# Patient Record
Sex: Female | Born: 1975 | Race: White | Hispanic: No | Marital: Married | Smoking: Never smoker
Health system: Southern US, Community
[De-identification: ages and names within clinical notes are randomized; demographics above are authoritative.]

## PROBLEM LIST (undated history)

## (undated) DIAGNOSIS — F419 Anxiety disorder, unspecified: Secondary | ICD-10-CM

## (undated) DIAGNOSIS — M2550 Pain in unspecified joint: Secondary | ICD-10-CM

## (undated) DIAGNOSIS — G540 Brachial plexus disorders: Secondary | ICD-10-CM

## (undated) DIAGNOSIS — K829 Disease of gallbladder, unspecified: Secondary | ICD-10-CM

## (undated) DIAGNOSIS — Z9889 Other specified postprocedural states: Secondary | ICD-10-CM

## (undated) DIAGNOSIS — M549 Dorsalgia, unspecified: Secondary | ICD-10-CM

## (undated) DIAGNOSIS — R112 Nausea with vomiting, unspecified: Secondary | ICD-10-CM

## (undated) DIAGNOSIS — R002 Palpitations: Secondary | ICD-10-CM

## (undated) DIAGNOSIS — F988 Other specified behavioral and emotional disorders with onset usually occurring in childhood and adolescence: Secondary | ICD-10-CM

## (undated) DIAGNOSIS — K219 Gastro-esophageal reflux disease without esophagitis: Secondary | ICD-10-CM

## (undated) DIAGNOSIS — Z91018 Allergy to other foods: Secondary | ICD-10-CM

## (undated) DIAGNOSIS — K5900 Constipation, unspecified: Secondary | ICD-10-CM

## (undated) DIAGNOSIS — G43909 Migraine, unspecified, not intractable, without status migrainosus: Secondary | ICD-10-CM

## (undated) DIAGNOSIS — I10 Essential (primary) hypertension: Secondary | ICD-10-CM

## (undated) DIAGNOSIS — F430 Acute stress reaction: Secondary | ICD-10-CM

## (undated) DIAGNOSIS — K589 Irritable bowel syndrome without diarrhea: Secondary | ICD-10-CM

## (undated) DIAGNOSIS — R079 Chest pain, unspecified: Secondary | ICD-10-CM

## (undated) HISTORY — PX: KNEE ARTHROSCOPY: SUR90

## (undated) HISTORY — DX: Allergy to other foods: Z91.018

## (undated) HISTORY — DX: Constipation, unspecified: K59.00

## (undated) HISTORY — PX: COLONOSCOPY: SHX174

## (undated) HISTORY — PX: UPPER GI ENDOSCOPY: SHX6162

## (undated) HISTORY — DX: Acute stress reaction: F43.0

## (undated) HISTORY — DX: Anxiety disorder, unspecified: F41.9

## (undated) HISTORY — DX: Brachial plexus disorders: G54.0

## (undated) HISTORY — DX: Migraine, unspecified, not intractable, without status migrainosus: G43.909

## (undated) HISTORY — DX: Gastro-esophageal reflux disease without esophagitis: K21.9

## (undated) HISTORY — DX: Palpitations: R00.2

## (undated) HISTORY — DX: Pain in unspecified joint: M25.50

## (undated) HISTORY — DX: Other specified behavioral and emotional disorders with onset usually occurring in childhood and adolescence: F98.8

## (undated) HISTORY — DX: Essential (primary) hypertension: I10

## (undated) HISTORY — PX: TONSILLECTOMY: SUR1361

## (undated) HISTORY — PX: GANGLION CYST EXCISION: SHX1691

## (undated) HISTORY — DX: Dorsalgia, unspecified: M54.9

## (undated) HISTORY — PX: REFRACTIVE SURGERY: SHX103

## (undated) HISTORY — DX: Disease of gallbladder, unspecified: K82.9

## (undated) HISTORY — PX: ENDOMETRIAL ABLATION: SHX621

## (undated) HISTORY — PX: CHOLECYSTECTOMY: SHX55

## (undated) HISTORY — PX: TUBAL LIGATION: SHX77

## (undated) HISTORY — DX: Chest pain, unspecified: R07.9

## (undated) HISTORY — DX: Irritable bowel syndrome, unspecified: K58.9

---

## 1998-04-11 ENCOUNTER — Inpatient Hospital Stay (HOSPITAL_COMMUNITY): Admission: AD | Admit: 1998-04-11 | Discharge: 1998-04-14 | Payer: Self-pay | Admitting: Obstetrics & Gynecology

## 1998-05-14 ENCOUNTER — Other Ambulatory Visit: Admission: RE | Admit: 1998-05-14 | Discharge: 1998-05-14 | Payer: Self-pay | Admitting: Obstetrics & Gynecology

## 1999-06-24 ENCOUNTER — Other Ambulatory Visit: Admission: RE | Admit: 1999-06-24 | Discharge: 1999-06-24 | Payer: Self-pay | Admitting: Obstetrics & Gynecology

## 2000-08-12 ENCOUNTER — Other Ambulatory Visit: Admission: RE | Admit: 2000-08-12 | Discharge: 2000-08-12 | Payer: Self-pay | Admitting: Obstetrics & Gynecology

## 2001-08-16 ENCOUNTER — Other Ambulatory Visit: Admission: RE | Admit: 2001-08-16 | Discharge: 2001-08-16 | Payer: Self-pay | Admitting: Obstetrics & Gynecology

## 2002-05-11 ENCOUNTER — Ambulatory Visit (HOSPITAL_COMMUNITY): Admission: RE | Admit: 2002-05-11 | Discharge: 2002-05-11 | Payer: Self-pay | Admitting: Obstetrics & Gynecology

## 2003-03-22 ENCOUNTER — Encounter: Admission: RE | Admit: 2003-03-22 | Discharge: 2003-03-22 | Payer: Self-pay | Admitting: Family Medicine

## 2003-08-02 ENCOUNTER — Ambulatory Visit (HOSPITAL_COMMUNITY): Admission: RE | Admit: 2003-08-02 | Discharge: 2003-08-02 | Payer: Self-pay | Admitting: General Surgery

## 2003-08-15 ENCOUNTER — Encounter: Admission: RE | Admit: 2003-08-15 | Discharge: 2003-08-15 | Payer: Self-pay | Admitting: Family Medicine

## 2003-08-15 ENCOUNTER — Encounter: Admission: RE | Admit: 2003-08-15 | Discharge: 2003-08-15 | Payer: Self-pay | Admitting: General Surgery

## 2004-02-25 ENCOUNTER — Other Ambulatory Visit: Admission: RE | Admit: 2004-02-25 | Discharge: 2004-02-25 | Payer: Self-pay | Admitting: Obstetrics & Gynecology

## 2004-06-05 ENCOUNTER — Encounter: Admission: RE | Admit: 2004-06-05 | Discharge: 2004-06-05 | Payer: Self-pay | Admitting: Family Medicine

## 2005-03-03 ENCOUNTER — Other Ambulatory Visit: Admission: RE | Admit: 2005-03-03 | Discharge: 2005-03-03 | Payer: Self-pay | Admitting: Obstetrics & Gynecology

## 2005-11-12 ENCOUNTER — Ambulatory Visit (HOSPITAL_COMMUNITY): Admission: RE | Admit: 2005-11-12 | Discharge: 2005-11-13 | Payer: Self-pay | Admitting: Surgery

## 2005-11-12 ENCOUNTER — Encounter (INDEPENDENT_AMBULATORY_CARE_PROVIDER_SITE_OTHER): Payer: Self-pay | Admitting: *Deleted

## 2006-05-30 ENCOUNTER — Encounter: Admission: RE | Admit: 2006-05-30 | Discharge: 2006-05-30 | Payer: Self-pay | Admitting: Family Medicine

## 2006-10-07 ENCOUNTER — Ambulatory Visit (HOSPITAL_COMMUNITY): Admission: RE | Admit: 2006-10-07 | Discharge: 2006-10-07 | Payer: Self-pay | Admitting: Obstetrics & Gynecology

## 2008-03-15 DIAGNOSIS — F430 Acute stress reaction: Secondary | ICD-10-CM

## 2008-03-15 HISTORY — DX: Acute stress reaction: F43.0

## 2009-09-12 DIAGNOSIS — F988 Other specified behavioral and emotional disorders with onset usually occurring in childhood and adolescence: Secondary | ICD-10-CM

## 2009-09-12 HISTORY — DX: Other specified behavioral and emotional disorders with onset usually occurring in childhood and adolescence: F98.8

## 2010-01-27 ENCOUNTER — Ambulatory Visit (HOSPITAL_COMMUNITY): Admission: RE | Admit: 2010-01-27 | Discharge: 2010-01-27 | Payer: Self-pay | Admitting: Family Medicine

## 2010-07-28 NOTE — Op Note (Signed)
Andrea Gilbert, Andrea Gilbert          ACCOUNT NO.:  [id number]   MEDICAL RECORD NO.:  [id number]          PATIENT TYPE:  AMB   LOCATION:  SDC                           FACILITY:  [facility]   PHYSICIAN:  Ilda Mori, M.D.   DATE OF BIRTH:  1976-03-09   DATE OF PROCEDURE:  10/07/2006  DATE OF DISCHARGE:                               OPERATIVE REPORT   PREOPERATIVE DIAGNOSIS:  Menorrhagia and right lower quadrant pain.   POSTOPERATIVE DIAGNOSIS:  Menorrhagia and right lower quadrant pain,  pelvic adhesions.   PROCEDURE:  Hysteroscopy, Novasure endometrial ablation, diagnostic  laparoscopy with lysis of pelvic adhesions.   SURGEON:  Dr. Ilda Mori.   ANESTHESIA:  General endotracheal.   ESTIMATED BLOOD LOSS:  Minimal.   FINDINGS:  The uterus cavity was 8.5 cm and no polyps or myoma were  seen.  Endometrial ablation was carried out.  On the laparoscopy, the  ovaries were normal bilaterally.  The uterus appeared normal.  There was  a filmy adhesion in the cul-de-sac on the right side and the area of  hemorrhage consistent with possible endometriosis.   INDICATIONS:  This is a 35 year old gravida 2, para 2 female who  developed severe menorrhagia status post removal of a Jearld Adjutant IUD which  had been placed for menstrual problems approximately one year ago.  The  IUD was removed because of hormonal side effects that caused the patient  to feel unwell.  Upon removal of the IUD the patient developed heavy  periods once again and a decision was made to proceed with endometrial  ablation.  In addition, the patient has complained of severe right lower  quadrant pain and decision was made to do a diagnostic laparoscopy at  that time ablation.   PROCEDURE:  The patient was brought to the operating room and placed in  the dorsal lithotomy position after general endotracheal anesthesia had  been induced.  The abdomen, perineum and vagina were prepped and draped  in sterile fashion.  The bladder  was catheterized.  The cervix was  grasped with a single-tooth tenaculum.  The cervix was sounded to 3 cm.  The uterus and cervix sounded to 8.5 cm leaving a cavity a diameter of  5.5 cm.  The endocervical canal was dilated with Shawnie Pons dilators to 23-  [place].  A hysteroscope was introduced and no polyps or myoma were seen.  The Novasure instrument was then introduced into the endometrial cavity  and deployed without difficulty.  The cavity width was 2.9 cm the depth  was 5.5 cm power was 88 and the length of ablation was one minute and 30  seconds.  After the ablation, the hysteroscope was reintroduced and the  entire cavity appeared to be well ablated.  At this point a Hulka  tenaculum was placed through the endocervical canal and affixed to the  anterior lip of the cervix.  The surgeon regloved and incisions were  made at the base of the umbilicus in the suprapubic area.  A 5 mm trocar  was introduced into the umbilicus after a pneumoperitoneum had been  created with a Veress needle.  The laparoscope  was then placed and the  second trocar was introduced through the suprapubic stab wound.  The  pelvis was viewed with the findings noted above.  Attention was turned  to the cul-de-sac adhesion which was fine and filmy.  It  was then  cauterized with bipolar and then cut.  The areas of possible  endometriosis were noted to be  superior to the course of the ureter and carefully cauterized as well.  At this point the procedure was terminated.  The gas was allowed to  escape.  The laparoscopic incisions were closed with Dermabond adhesive.  All instrument, and sponge counts were correct.      Ilda Mori, M.D.  Electronically Signed     RK/MEDQ  D:  10/07/2006  T:  10/08/2006  Job:  [id number]

## 2010-07-31 NOTE — Op Note (Signed)
NAME:  Andrea Gilbert, Andrea Gilbert                    ACCOUNT NO.:  [id number]   MEDICAL RECORD NO.:  [id number]                   PATIENT TYPE:  AMB   LOCATION:  SDC                                  FACILITY:  [facility]   PHYSICIAN:  Ilda Mori, M.D.                DATE OF BIRTH:  Dec 27, 1975   DATE OF PROCEDURE:  05/11/2002  DATE OF DISCHARGE:                                 OPERATIVE REPORT   PREOPERATIVE DIAGNOSIS:  Voluntary sterilization.   POSTOPERATIVE DIAGNOSIS:  Voluntary sterilization.   PROCEDURE:  Laparoscopic bilateral tubal cautery for sterilization.   SURGEON:  Teena Irani. Arlyce Dice, M.D.   ANESTHESIA:  General endotracheal.   ESTIMATED BLOOD LOSS:  5 mL.   FINDINGS:  Normal-appearing tubes and ovaries.  No postoperative adhesions  from her previous cesarean sections.  No evidence of endometriosis.  Liver  edge and gallbladder appeared normal.   INDICATIONS:  This is a 35 year old gravida 2, para 2, status post C-section  x2, who desires permanent sterilization.  Alternative methods of non-  permanent birth control were discussed with the patient as well as the fact  that tubal ligation had a five per thousand failure rate.  The patient opted  to proceed.   DESCRIPTION OF PROCEDURE:  The patient was brought to the operating room and  placed in a supine position, where general endotracheal anesthesia was  induced.  She was then placed in the dorsal lithotomy position and the  abdomen, perineum, and vagina were prepped and draped in a sterile fashion.  The cervix was visualized and a Hulka tenaculum was introduced through the  endocervical canal and fixed to the anterior lip of the cervix.  The bladder  was catheterized.  The surgeon re-gowned and gloved, and a small incision  was made at the base of the umbilicus and the 5 mm trocar was introduced.  A  5 mm scope was then placed through the trocar and the pelvis was viewed with  the findings noted above.  An accessory  instrument was placed through a  suprapubic stab wound, a 5 mm sleeve was placed, and the Kleppinger forceps  were introduced into the peritoneal cavity.  The left tube was grasped in  the isthmic ampullary area and cauterized on a 3-4 cm length, leaving 0.5 cm  of normal-appearing tube proximal to the burn site.  The identical procedure  was then carried out on the contralateral tube.  The procedure was then  terminated.  The gas was allowed to escape.  The incisions were closed with  Dermabond.                                               Ilda Mori, M.D.    RK/MEDQ  D:  05/11/2002  T:  05/11/2002  Job:  [id number]

## 2010-07-31 NOTE — Op Note (Signed)
NAMESUESAN, MOHRMANN          ACCOUNT NO.:  [id number]   MEDICAL RECORD NO.:  [id number]          PATIENT TYPE:  OIB   LOCATION:  5704                         FACILITY:  [facility]   PHYSICIAN:  Sandria Bales. Ezzard Standing, M.D.  DATE OF BIRTH:  26-Oct-1975   DATE OF PROCEDURE:  DATE OF DISCHARGE:                                 OPERATIVE REPORT   PREOPERATIVE DIAGNOSES:  Cholelithiasis, chronic cholecystitis.   POSTOPERATIVE DIAGNOSES:  Cholecystitis and cholelithiasis.   PROCEDURE:  Laparoscopic cholecystectomy with intraoperative cholangiogram.   SURGEON:  Sandria Bales. Ezzard Standing, M.D.   FIRST ASSISTANT:  Leonie Man, M.D.   ANESTHESIA:  General endotracheal.   ESTIMATED BLOOD LOSS:  Minimal.   PROCEDURE:  Ms. Geffert is a 35 year old white female, who has had  recurrent abdominal pain and ultrasound has revealed a 2.3 cm gallstone, and  she now comes for attempted laparoscopic cholecystectomy.   The indications and potential complications of the procedure are explained  to the patient.  Potential complications include but are not limited to  bleeding, infection, bile duct injury and open surgery.   OPERATIVE NOTE:  Patient placed in spinal position, given a general  endotracheal anesthetic.  Her abdomen was placed with Betadine solution,  sterilely draped and infraumbilical incision was made with sharp dissection  carried down to the abdominal cavity.  A zero-degree, 10-mm laparoscope was  inserted through a 12-mm Hasson trocar.  The Hasson trocar was secured with  a 0-Vicryl suture.   Three different trocars were placed; a 10-mm subxiphoid trocar, a 5-mm right  mid subcostal and a 5-mm lateral subcostal trocar.  Abdominal exploration  revealed right and left lobe of the liver unremarkable, though there were  cysts.  Both ovaries were visualized.  There were cysts on both ovaries.  Her stomach was unremarkable.  Attention was then turned to the gallbladder.   The gallbladder was  grasped rotated cephalad.  The gallbladder was sharply  and bluntly dissected down to the cystic duct.  There were some adhesions of  the duodenum along the anterior wall of the gallbladder.  Identified the  cystic artery, which was doubly clipped and divided.  Identified the cystic  duct.  A clip was placed on the gallbladder side of the cystic duct.  Sharp  dissection was carried out, identifying the cystic duct for cholangiogram.   The cholangiogram was obtained using a cutoff Taut catheter inserted through  a 14-gauge jelco catheter into the abdomen and then into the side of the  cystic duct.  The catheter was secured with an endoclip.  I used half-  strength Hyapaque solution, about 8 mL, under fluoroscopy, showing a free  flow of contrast down the cystic duct into common bile duct, up the hepatic  radicals and down into the duodenum.  This was thought to be a normal  intraoperative cholangiogram.   The Taut catheter was then removed, the cystic duct triply clipped and  divided.  The gallbladder was then sharply and bluntly dissected from the  gallbladder bed.  There were one or two additional clips placed on the  mesentery of the gallbladder.  Prior  to complete division of the gallbladder  from the gallbladder bed, I revisualized the [place].  I  revisualized the gallbladder bed.  There was no bile leak, no bleeding.   I then divided the gallbladder from the gallbladder bed, placing it into a  catch bag and delivering it through the umbilicus.   I irrigated the abdomen with 1 liter of saline.  The ports were then  removed.  The umbilical port was closed with a 0-Vicryl suture.  The site  was closed with a 5-0 Vicryl suture.  The skin site was closed with a 5-0  Vicryl suture, painted with a tincture of benzoin and Steri-Stripped.  The  patient tolerated to procedure well, was transported to recovery room in  good condition.      Sandria Bales. Ezzard Standing, M.D.   Electronically Signed     DHN/MEDQ  D:  11/12/2005  T:  11/12/2005  Job:  [id number]   cc:   Bryan Lemma. Manus Gunning, M.D.  Milus Height, PA

## 2010-12-28 LAB — CBC
MCV: 90.8
Platelets: 288
RBC: 4.7
WBC: 8

## 2011-08-31 ENCOUNTER — Other Ambulatory Visit: Payer: Self-pay | Admitting: Podiatry

## 2011-08-31 ENCOUNTER — Ambulatory Visit (HOSPITAL_COMMUNITY)
Admission: RE | Admit: 2011-08-31 | Discharge: 2011-08-31 | Disposition: A | Discharge status: Home / Self Care | Payer: 59 | Source: Ambulatory Visit | Attending: Podiatry | Admitting: Podiatry

## 2011-08-31 DIAGNOSIS — M79673 Pain in unspecified foot: Secondary | ICD-10-CM

## 2011-08-31 DIAGNOSIS — R52 Pain, unspecified: Secondary | ICD-10-CM

## 2011-08-31 DIAGNOSIS — M79609 Pain in unspecified limb: Secondary | ICD-10-CM | POA: Insufficient documentation

## 2012-03-31 ENCOUNTER — Ambulatory Visit
Admission: RE | Admit: 2012-03-31 | Discharge: 2012-03-31 | Disposition: A | Discharge status: Home / Self Care | Payer: 59 | Source: Ambulatory Visit | Attending: Gastroenterology | Admitting: Gastroenterology

## 2012-03-31 ENCOUNTER — Other Ambulatory Visit: Payer: Self-pay | Admitting: Gastroenterology

## 2012-03-31 DIAGNOSIS — R1011 Right upper quadrant pain: Secondary | ICD-10-CM

## 2013-05-21 ENCOUNTER — Ambulatory Visit: Payer: Self-pay | Admitting: Podiatry

## 2014-03-06 ENCOUNTER — Other Ambulatory Visit: Payer: Self-pay

## 2014-03-07 LAB — CYTOLOGY - PAP

## 2015-04-03 ENCOUNTER — Other Ambulatory Visit: Payer: Self-pay | Admitting: Family Medicine

## 2015-04-08 ENCOUNTER — Other Ambulatory Visit: Payer: Self-pay | Admitting: Family Medicine

## 2015-04-08 DIAGNOSIS — M79604 Pain in right leg: Secondary | ICD-10-CM

## 2016-02-27 ENCOUNTER — Telehealth: Payer: Self-pay

## 2016-02-27 NOTE — Telephone Encounter (Signed)
NOTES SENT TO SCHEDULING.  °

## 2016-03-17 DIAGNOSIS — Z01419 Encounter for gynecological examination (general) (routine) without abnormal findings: Secondary | ICD-10-CM | POA: Diagnosis not present

## 2016-03-17 NOTE — Progress Notes (Signed)
Cardiology Office Note   Date:  03/22/2016   ID:  Andrea Gilbert, DOB 05/06/1975, MRN [id number]  PCP:  Andrea Gilbert  Cardiologist:   Andrea HawsPeter Iley Deignan, MD   Chief Complaint  Patient presents with  . Establish Care  . Chest Pain      History of Present Illness: Andrea LawmanChristina M Gilbert is a 41 y.o. female who presents for evaluation of chest pain. Has have for over a year.  Atypical non exertional had CXR and ECG in 04/2105 normal Takes dexilant for GERD but pains Sharper and not related to food. Sometimes worse with deep breath. Not positional. No history of connective tissue disease or arthritis.  Has not had stress testing  I take care of her Dad Andrea Gilbert who has advance CAD   LDL 115  Past Medical History:  Diagnosis Date  . ADD (attention deficit disorder) 09/2009  . IBS (irritable bowel syndrome)   . Migraine   . Multiple food allergies   . Stress reaction 2010  . Thoracic outlet syndrome     Past Surgical History:  Procedure Laterality Date  . CESAREAN SECTION    . CHOLECYSTECTOMY    . GANGLION CYST EXCISION    . KNEE ARTHROSCOPY Right    X 2  . REFRACTIVE SURGERY    . TONSILLECTOMY       Current Outpatient Prescriptions  Medication Sig Dispense Refill  . cyclobenzaprine (FLEXERIL) 10 MG tablet Take 10 mg by mouth 3 (three) times daily as needed for muscle spasms.    Marland Kitchen. EPINEPHrine 0.3 mg/0.3 mL IJ SOAJ injection Inject 0.3 mg into the muscle once.    . lisdexamfetamine (VYVANSE) 40 MG capsule Take 40 mg by mouth as directed.     . meclizine (ANTIVERT) 25 MG tablet Take 25 mg by mouth 3 (three) times daily as needed for dizziness.     No current facility-administered medications for this visit.     Allergies:   Citric acid; Concerta [methylphenidate hcl er (cd)]; Adderall [amphetamine-dextroamphetamine]; Codeine; Duratuss g [guaifenesin]; Strattera [atomoxetine hcl]; and Sulfa antibiotics    Social History:  The patient  reports that she has been smoking  Cigarettes.  She has never used smokeless tobacco. She reports that she does not drink alcohol or use drugs.   Family History:  The patient's family history includes CAD in her father; Cancer in her father; Diabetes in her father and paternal grandfather.    ROS:  Please see the history of present illness.   Otherwise, review of systems are positive for none.   All other systems are reviewed and negative.    PHYSICAL EXAM: VS:  BP (!) 150/96   Pulse 92   Ht 5\' 2"  (1.575 m)   Wt 195 lb (88.5 kg)   SpO2 98%   BMI 35.67 kg/m  , BMI Body mass index is 35.67 kg/m. Affect appropriate Healthy:  appears stated age HEENT: normal Neck supple with no adenopathy JVP normal no bruits no thyromegaly Lungs clear with no wheezing and good diaphragmatic motion Heart:  S1/S2 no murmur, no rub, gallop or click PMI normal Abdomen: benighn, BS positve, no tenderness, no AAA no bruit.  No HSM or HJR Distal pulses intact with no bruits No edema Neuro non-focal Skin warm and dry No muscular weakness    EKG:  SR rate 89 normal 04/28/15  CXR:   NAD   Recent Labs: No results found for requested labs within last 8760 hours.    Lipid  Panel No results found for: CHOL, TRIG, HDL, CHOLHDL, VLDL, LDLCALC, LDLDIRECT    Wt Readings from Last 3 Encounters:  03/22/16 195 lb (88.5 kg)      Other studies Reviewed: Additional studies/ records that were reviewed today include: Notes Dr Richardo Priest ECG and labs .    ASSESSMENT AND PLAN:  1. Chest Pain:  Atypical suggested calcium score F/U ETT since ECG is normal 2. GERD: continue dexilant    Current medicines are reviewed at length with the patient today.  The patient does not have concerns regarding medicines.  The following changes have been made:  no change  Labs/ tests ordered today include: ETT Calcium Score   Orders Placed This Encounter  Procedures  . Exercise Tolerance Test  . EKG 12-Lead     Disposition:   FU with me in a year        Signed, Andrea Haws, MD  03/22/2016 9:48 AM    [facility] [address] [place], [place], [place]  [address] Phone: [phone]; Fax: [phone]

## 2016-03-22 ENCOUNTER — Encounter: Payer: Self-pay | Admitting: Cardiovascular Disease

## 2016-03-22 ENCOUNTER — Encounter (INDEPENDENT_AMBULATORY_CARE_PROVIDER_SITE_OTHER): Payer: Self-pay

## 2016-03-22 ENCOUNTER — Ambulatory Visit (INDEPENDENT_AMBULATORY_CARE_PROVIDER_SITE_OTHER): Payer: 59 | Admitting: Cardiovascular Disease

## 2016-03-22 VITALS — BP 150/96 | HR 92 | Ht 62.0 in | Wt 195.0 lb

## 2016-03-22 DIAGNOSIS — Z7689 Persons encountering health services in other specified circumstances: Secondary | ICD-10-CM | POA: Diagnosis not present

## 2016-03-22 DIAGNOSIS — R079 Chest pain, unspecified: Secondary | ICD-10-CM | POA: Diagnosis not present

## 2016-03-22 NOTE — Addendum Note (Signed)
Addended by: Virl AxePATE INGALLS, Uilani Sanville L on: 03/22/2016 09:58 AM   Modules accepted: Orders

## 2016-03-22 NOTE — Patient Instructions (Addendum)
Medication Instructions:  Your physician recommends that you continue on your current medications as directed. Please refer to the Current Medication list given to you today.  Labwork: NONE  Testing/Procedures: Your physician has requested that you have an exercise tolerance test. For further information please visit [email or web address] Please also follow instruction sheet, as given.  Cardiac CT calcium score, (CAT scanning), is a noninvasive, special x-ray that produces cross-sectional images of the body using x-rays and a computer. If you are wanting to schedule this test please give our office a call [phone].  Follow-Up: Your physician wants you to follow-up in: 12 months with Dr. Eden EmmsNishan. You will receive a reminder letter in the mail two months in advance. If you don't receive a letter, please call our office to schedule the follow-up appointment.   If you need a refill on your cardiac medications before your next appointment, please call your pharmacy.

## 2016-04-02 ENCOUNTER — Other Ambulatory Visit: Payer: Self-pay | Admitting: Physician Assistant

## 2016-04-02 ENCOUNTER — Ambulatory Visit
Admission: RE | Admit: 2016-04-02 | Discharge: 2016-04-02 | Disposition: A | Discharge status: Home / Self Care | Payer: 59 | Source: Ambulatory Visit | Attending: Physician Assistant | Admitting: Physician Assistant

## 2016-04-02 DIAGNOSIS — R1031 Right lower quadrant pain: Secondary | ICD-10-CM

## 2016-04-02 DIAGNOSIS — M545 Low back pain: Secondary | ICD-10-CM | POA: Diagnosis not present

## 2016-04-02 DIAGNOSIS — R1903 Right lower quadrant abdominal swelling, mass and lump: Secondary | ICD-10-CM | POA: Diagnosis not present

## 2016-04-02 MED ORDER — IOPAMIDOL (ISOVUE-300) INJECTION 61%
100.0000 mL | Freq: Once | INTRAVENOUS | Status: AC | PRN
  Administered 2016-04-02: 100 mL via INTRAVENOUS

## 2016-04-30 ENCOUNTER — Other Ambulatory Visit: Payer: Self-pay | Admitting: Family Medicine

## 2016-04-30 ENCOUNTER — Ambulatory Visit (INDEPENDENT_AMBULATORY_CARE_PROVIDER_SITE_OTHER)
Admission: RE | Admit: 2016-04-30 | Discharge: 2016-04-30 | Disposition: A | Discharge status: Home / Self Care | Payer: Self-pay | Source: Ambulatory Visit | Attending: Cardiovascular Disease | Admitting: Cardiovascular Disease

## 2016-04-30 ENCOUNTER — Ambulatory Visit (INDEPENDENT_AMBULATORY_CARE_PROVIDER_SITE_OTHER): Payer: 59

## 2016-04-30 DIAGNOSIS — R079 Chest pain, unspecified: Secondary | ICD-10-CM

## 2016-04-30 DIAGNOSIS — Z1231 Encounter for screening mammogram for malignant neoplasm of breast: Secondary | ICD-10-CM

## 2016-04-30 LAB — EXERCISE TOLERANCE TEST
CHL CUP MPHR: 179 {beats}/min
CHL CUP RESTING HR STRESS: 107 {beats}/min
CSEPEDS: 0 s
CSEPPHR: 184 {beats}/min
Estimated workload: 11.7 METS
Exercise duration (min): 10 min
Percent HR: 103 %
RPE: 17

## 2016-05-03 ENCOUNTER — Ambulatory Visit
Admission: RE | Admit: 2016-05-03 | Discharge: 2016-05-03 | Disposition: A | Discharge status: Home / Self Care | Payer: 59 | Source: Ambulatory Visit | Attending: Family Medicine | Admitting: Family Medicine

## 2016-05-03 DIAGNOSIS — Z1231 Encounter for screening mammogram for malignant neoplasm of breast: Secondary | ICD-10-CM

## 2016-05-04 DIAGNOSIS — M545 Low back pain: Secondary | ICD-10-CM | POA: Diagnosis not present

## 2016-05-04 DIAGNOSIS — R030 Elevated blood-pressure reading, without diagnosis of hypertension: Secondary | ICD-10-CM | POA: Diagnosis not present

## 2016-05-17 DIAGNOSIS — M545 Low back pain: Secondary | ICD-10-CM | POA: Diagnosis not present

## 2016-05-17 DIAGNOSIS — M25551 Pain in right hip: Secondary | ICD-10-CM | POA: Diagnosis not present

## 2016-05-21 DIAGNOSIS — M25551 Pain in right hip: Secondary | ICD-10-CM | POA: Diagnosis not present

## 2016-05-21 DIAGNOSIS — M545 Low back pain: Secondary | ICD-10-CM | POA: Diagnosis not present

## 2016-05-25 DIAGNOSIS — M545 Low back pain: Secondary | ICD-10-CM | POA: Diagnosis not present

## 2016-05-25 DIAGNOSIS — M25551 Pain in right hip: Secondary | ICD-10-CM | POA: Diagnosis not present

## 2016-05-28 DIAGNOSIS — M25551 Pain in right hip: Secondary | ICD-10-CM | POA: Diagnosis not present

## 2016-05-28 DIAGNOSIS — M545 Low back pain: Secondary | ICD-10-CM | POA: Diagnosis not present

## 2016-06-01 DIAGNOSIS — R030 Elevated blood-pressure reading, without diagnosis of hypertension: Secondary | ICD-10-CM | POA: Diagnosis not present

## 2016-06-01 DIAGNOSIS — M545 Low back pain: Secondary | ICD-10-CM | POA: Diagnosis not present

## 2016-06-02 DIAGNOSIS — M25551 Pain in right hip: Secondary | ICD-10-CM | POA: Diagnosis not present

## 2016-06-02 DIAGNOSIS — M545 Low back pain: Secondary | ICD-10-CM | POA: Diagnosis not present

## 2016-06-07 DIAGNOSIS — M545 Low back pain: Secondary | ICD-10-CM | POA: Diagnosis not present

## 2016-06-07 DIAGNOSIS — M25551 Pain in right hip: Secondary | ICD-10-CM | POA: Diagnosis not present

## 2016-06-10 DIAGNOSIS — M25551 Pain in right hip: Secondary | ICD-10-CM | POA: Diagnosis not present

## 2016-06-10 DIAGNOSIS — M545 Low back pain: Secondary | ICD-10-CM | POA: Diagnosis not present

## 2016-06-14 DIAGNOSIS — M545 Low back pain: Secondary | ICD-10-CM | POA: Diagnosis not present

## 2016-06-14 DIAGNOSIS — M25551 Pain in right hip: Secondary | ICD-10-CM | POA: Diagnosis not present

## 2016-06-18 DIAGNOSIS — M545 Low back pain: Secondary | ICD-10-CM | POA: Diagnosis not present

## 2016-06-18 DIAGNOSIS — M25551 Pain in right hip: Secondary | ICD-10-CM | POA: Diagnosis not present

## 2016-06-21 DIAGNOSIS — M545 Low back pain: Secondary | ICD-10-CM | POA: Diagnosis not present

## 2016-06-21 DIAGNOSIS — M25551 Pain in right hip: Secondary | ICD-10-CM | POA: Diagnosis not present

## 2016-06-25 DIAGNOSIS — M545 Low back pain: Secondary | ICD-10-CM | POA: Diagnosis not present

## 2016-06-25 DIAGNOSIS — M25551 Pain in right hip: Secondary | ICD-10-CM | POA: Diagnosis not present

## 2016-06-29 DIAGNOSIS — M545 Low back pain: Secondary | ICD-10-CM | POA: Diagnosis not present

## 2016-06-29 DIAGNOSIS — M25551 Pain in right hip: Secondary | ICD-10-CM | POA: Diagnosis not present

## 2016-07-02 DIAGNOSIS — M25551 Pain in right hip: Secondary | ICD-10-CM | POA: Diagnosis not present

## 2016-07-02 DIAGNOSIS — M545 Low back pain: Secondary | ICD-10-CM | POA: Diagnosis not present

## 2016-07-06 DIAGNOSIS — M545 Low back pain: Secondary | ICD-10-CM | POA: Diagnosis not present

## 2016-07-06 DIAGNOSIS — M25551 Pain in right hip: Secondary | ICD-10-CM | POA: Diagnosis not present

## 2016-07-09 DIAGNOSIS — M545 Low back pain: Secondary | ICD-10-CM | POA: Diagnosis not present

## 2016-07-09 DIAGNOSIS — M25551 Pain in right hip: Secondary | ICD-10-CM | POA: Diagnosis not present

## 2016-07-13 DIAGNOSIS — M25551 Pain in right hip: Secondary | ICD-10-CM | POA: Diagnosis not present

## 2016-07-13 DIAGNOSIS — M545 Low back pain: Secondary | ICD-10-CM | POA: Diagnosis not present

## 2016-07-16 DIAGNOSIS — M25551 Pain in right hip: Secondary | ICD-10-CM | POA: Diagnosis not present

## 2016-07-16 DIAGNOSIS — M545 Low back pain: Secondary | ICD-10-CM | POA: Diagnosis not present

## 2016-08-20 ENCOUNTER — Other Ambulatory Visit: Payer: Self-pay | Admitting: Family Medicine

## 2016-08-20 ENCOUNTER — Ambulatory Visit
Admission: RE | Admit: 2016-08-20 | Discharge: 2016-08-20 | Disposition: A | Discharge status: Home / Self Care | Payer: 59 | Source: Ambulatory Visit | Attending: Family Medicine | Admitting: Family Medicine

## 2016-08-20 DIAGNOSIS — M47816 Spondylosis without myelopathy or radiculopathy, lumbar region: Secondary | ICD-10-CM | POA: Diagnosis not present

## 2016-08-20 DIAGNOSIS — M545 Low back pain: Secondary | ICD-10-CM

## 2016-08-20 DIAGNOSIS — R1031 Right lower quadrant pain: Secondary | ICD-10-CM | POA: Diagnosis not present

## 2016-08-24 ENCOUNTER — Encounter: Payer: Self-pay | Admitting: Sports Medicine

## 2016-09-13 ENCOUNTER — Ambulatory Visit: Payer: 59 | Admitting: Sports Medicine

## 2016-09-14 ENCOUNTER — Ambulatory Visit: Payer: 59 | Admitting: Sports Medicine

## 2016-09-16 DIAGNOSIS — H40013 Open angle with borderline findings, low risk, bilateral: Secondary | ICD-10-CM | POA: Diagnosis not present

## 2016-09-21 ENCOUNTER — Ambulatory Visit (INDEPENDENT_AMBULATORY_CARE_PROVIDER_SITE_OTHER): Payer: 59 | Admitting: Sports Medicine

## 2016-09-21 VITALS — BP 174/102 | Ht 62.0 in | Wt 193.0 lb

## 2016-09-21 DIAGNOSIS — R1031 Right lower quadrant pain: Secondary | ICD-10-CM

## 2016-09-21 NOTE — Progress Notes (Signed)
   Subjective:    Patient ID: Andrea Gilbert, female    DOB: 06-02-1975, 41 y.o.   MRN: [id number]  HPI chief complaint: Right groin/lower abdominal pain  Very pleasant 41 year old female comes in today complaining of 7 months of right groin/lower abdominal pain. She initially noticed a lump around Christmas time. Workup by her PCP including a CT of the abdomen and pelvis was basically inconclusive. She was diagnosed with ovarian cysts but that was felt to be incidental. She has had intermittent discomfort around the area of the lump with radiating pain around the right side to the right SI joint. She also will get pain within her groin. She denies numbness or tingling. Her symptoms seem to be worse with certain activities such as walking or leaning to the side. X-rays of her lumbar spine and pelvis done recently were unremarkable. She's been told that the lump in question is probably a lipoma. She's had 2 months of physical therapy with minimal improvement in symptoms.  Past medical history reviewed Medications reviewed Allergies reviewed    Review of Systems    as above Objective:   Physical Exam  Obese. No acute distress. Sitting In exam room.  Right hip: Smooth painless hip range of motion with a negative log roll. There is a palpable mass over the anterior groin/lower abdomen which is minimally tender to palpation. There is a second smaller mass just superior and lateral to the larger mass. Good strength. Negative straight leg raise. Slightly positive FABER.  Lumbar spine: Full painless lumbar range of motion. No tenderness to palpation of the lumbar midline. No spasm.  CT scan of the abdomen and pelvis as well as results of lumbar spine and pelvis x-rays are as above.      Assessment & Plan:   Right groin pain of questionable etiology Soft tissue mass  Soft tissue mass in question appears to be a lipoma. The patient's groin pain may be originating from the hip joint  itself. It certainly does not sound radicular. I think we need to proceed with an MRI scan of her pelvis to rule out intra-articular hip pathology. We will also put a gel capsule over the soft tissue mass in question. Phone follow-up with those results when available. We will the lunate further treatment based on those findings. I do think she has a slight element of SI joint dysfunction on the right side but I do not believe that to be the main pain generator.

## 2016-09-25 ENCOUNTER — Ambulatory Visit
Admission: RE | Admit: 2016-09-25 | Discharge: 2016-09-25 | Disposition: A | Discharge status: Home / Self Care | Payer: 59 | Source: Ambulatory Visit | Attending: Sports Medicine | Admitting: Sports Medicine

## 2016-09-25 DIAGNOSIS — R1030 Lower abdominal pain, unspecified: Secondary | ICD-10-CM | POA: Diagnosis not present

## 2016-09-25 DIAGNOSIS — R1031 Right lower quadrant pain: Secondary | ICD-10-CM

## 2016-09-28 ENCOUNTER — Telehealth: Payer: Self-pay | Admitting: Sports Medicine

## 2016-09-28 NOTE — Telephone Encounter (Signed)
  I spoke with the patient on the phone today after reviewing the MRI of her pelvis. It is an unremarkable study. No evidence of lipoma. Hip joint is unremarkable. I [facility]. I question whether or not her symptoms are originating from her iliopsoas or quadratus lumborum. Reassurance offered regarding MRI findings. Follow-up as needed.

## 2016-09-30 ENCOUNTER — Other Ambulatory Visit: Payer: 59

## 2016-10-15 DIAGNOSIS — R030 Elevated blood-pressure reading, without diagnosis of hypertension: Secondary | ICD-10-CM | POA: Diagnosis not present

## 2016-10-15 DIAGNOSIS — J069 Acute upper respiratory infection, unspecified: Secondary | ICD-10-CM | POA: Diagnosis not present

## 2016-12-02 DIAGNOSIS — R030 Elevated blood-pressure reading, without diagnosis of hypertension: Secondary | ICD-10-CM | POA: Diagnosis not present

## 2016-12-20 DIAGNOSIS — R102 Pelvic and perineal pain: Secondary | ICD-10-CM | POA: Diagnosis not present

## 2017-03-02 DIAGNOSIS — Z0000 Encounter for general adult medical examination without abnormal findings: Secondary | ICD-10-CM | POA: Diagnosis not present

## 2017-03-02 DIAGNOSIS — R7309 Other abnormal glucose: Secondary | ICD-10-CM | POA: Diagnosis not present

## 2017-03-02 DIAGNOSIS — Z8349 Family history of other endocrine, nutritional and metabolic diseases: Secondary | ICD-10-CM | POA: Diagnosis not present

## 2017-03-02 DIAGNOSIS — Z23 Encounter for immunization: Secondary | ICD-10-CM | POA: Diagnosis not present

## 2017-03-02 NOTE — Patient Instructions (Addendum)
Your procedure is scheduled on:  Friday, Dec 28  Enter through the Main Entrance of [facility] at:  10:30 am  Pick up the phone at the desk and dial [phone].  Call this number if you have problems the morning of surgery: [phone].  Remember: Do NOT eat food or Do NOT drink clear liquids (including water) after midnight Thursday  Take these medicines the morning of surgery with a SIP OF WATER:  lexapro  Do NOT wear jewelry (body piercing), metal hair clips/bobby pins, make-up, or nail polish. Do NOT wear lotions, powders, or perfumes.  You may wear deoderant. Do NOT shave for 48 hours prior to surgery. Do NOT bring valuables to the hospital. Contacts may not be worn into surgery.  Have a responsible adult drive you home and stay with you for 24 hours after your procedure.  Home with Husband "Robby" cell [phone].

## 2017-03-04 ENCOUNTER — Encounter (HOSPITAL_COMMUNITY)
Admission: RE | Admit: 2017-03-04 | Discharge: 2017-03-04 | Disposition: A | Discharge status: Home / Self Care | Payer: 59 | Source: Ambulatory Visit | Attending: Obstetrics | Admitting: Obstetrics

## 2017-03-04 ENCOUNTER — Other Ambulatory Visit: Payer: Self-pay

## 2017-03-04 ENCOUNTER — Encounter (HOSPITAL_COMMUNITY): Payer: Self-pay

## 2017-03-04 DIAGNOSIS — Z01812 Encounter for preprocedural laboratory examination: Secondary | ICD-10-CM | POA: Insufficient documentation

## 2017-03-04 DIAGNOSIS — Z0183 Encounter for blood typing: Secondary | ICD-10-CM | POA: Diagnosis not present

## 2017-03-04 HISTORY — DX: Other specified postprocedural states: Z98.890

## 2017-03-04 HISTORY — DX: Nausea with vomiting, unspecified: R11.2

## 2017-03-04 LAB — CBC
HCT: 40.9 % (ref 36.0–46.0)
Hemoglobin: 14.2 g/dL (ref 12.0–15.0)
MCH: 31.5 pg (ref 26.0–34.0)
MCHC: 34.7 g/dL (ref 30.0–36.0)
MCV: 90.7 fL (ref 78.0–100.0)
Platelets: 225 10*3/uL (ref 150–400)
RBC: 4.51 MIL/uL (ref 3.87–5.11)
RDW: 12.5 % (ref 11.5–15.5)
WBC: 8.9 10*3/uL (ref 4.0–10.5)

## 2017-03-04 LAB — TYPE AND SCREEN
ABO/RH(D): A POS
Antibody Screen: NEGATIVE

## 2017-03-04 LAB — ABO/RH: ABO/RH(D): A POS

## 2017-03-10 DIAGNOSIS — H40013 Open angle with borderline findings, low risk, bilateral: Secondary | ICD-10-CM | POA: Diagnosis not present

## 2017-03-10 NOTE — H&P (Signed)
41 y.o. g2p2 presented as referral from Dr. Arlyce DiceKaplan for surgical evaluation of pelvic pain. She underwent an endometrial ablation in 2008 and had amenorrhea until Feb of this year. Since that time, intermittent light spotting, almost monthly. She often has pain with bleeding.  RLQ in early 2018. CT that showed 3 cm simple right ovarian cyst.  US follow up in 06/2016--right ovarian cyst resolved, new 3cm left ovarian cyst.  Long history of RLQ pain. Has worsened significantly over last year. Almost daily RLQ pain. [facility]. Per patient, normal Radiates from RLQ into back. Unrelated to position / sitting / standing. Did try physcial therapy--no improvement, dry nedling did not help. Saw sports medicine--no improvement.  Takes ibuprofen for pain. Rarely works. She was on pills prior to her BTL and maybe since. Did not feel that this helped.  Has been tracking cycles--spotting/bleeding about once a month. Always w pain w bleding. Pain on days that she is not spotting--no clear cyclic pattern w ovulation.   Some dyspareunia--elicits right sided pain.   Desires surgical evaluation of persistent pelvic pain      Past Medical History:  Diagnosis Date  . ADD (attention deficit disorder) 09/2009  . IBS (irritable bowel syndrome)   . Migraine    last one 01/2017, otc med prn  . Multiple food allergies   . PONV (postoperative nausea and vomiting)   . Stress reaction 2010  . Thoracic outlet syndrome     Past Surgical History:  Procedure Laterality Date  . CESAREAN SECTION  1994, 2000   x 2  . CHOLECYSTECTOMY    . COLONOSCOPY    . ENDOMETRIAL ABLATION    . GANGLION CYST EXCISION Left    hand  . KNEE ARTHROSCOPY Left    X 2  . REFRACTIVE SURGERY Left    Laser surgery Left eye  . TONSILLECTOMY    . TUBAL LIGATION    . UPPER GI ENDOSCOPY      OB History  No data available    Social History   Socioeconomic History  . Marital status: Married   Spouse name: Not on file  . Number of children: Not on file  . Years of education: Not on file  . Highest education level: Not on file  Social Needs  . Financial resource strain: Not on file  . Food insecurity - worry: Not on file  . Food insecurity - inability: Not on file  . Transportation needs - medical: Not on file  . Transportation needs - non-medical: Not on file  Occupational History  . Not on file  Tobacco Use  . Smoking status: Never Smoker  . Smokeless tobacco: Never Used  Substance and Sexual Activity  . Alcohol use: Yes    Comment: occasional  . Drug use: No  . Sexual activity: Not on file  Other Topics Concern  . Not on file  Social History Narrative  . Not on file   Citric acid; Concerta [methylphenidate hcl er (cd)]; Tomato; Adderall [amphetamine-dextroamphetamine]; Strattera [atomoxetine hcl]; and Sulfa antibiotics    Vitals:   03/11/17 1100  BP: (!) 150/96  Pulse: 82  Resp: 16  Temp: 98.1 F (36.7 C)  SpO2: 99%     General:  NAD Lungs: CTAB Cardiac: RRR Abdomen:  Soft Ex:  no edema  UPT neg  A/P   41 y.o. presents with persistent and worsening pelvic pain.  Marland Kitchen. Has failed ibuprofen, physical therapy,  dry needling. Light bleeding without true cycles post-ablation. No clear pattern relating to bleeding, ovulation or both. Discussed ddx to include endometriosis, adhesions, post ablation pain, dysmenorrhea, adenomyosis.   Desires diagnostic laparoscopy for evaluation of possible etiology of pain as well as management. Although no family history of ovarian cancer, will plan opportunistic bilateral salpingectomy at same time--also if pain post ablation, may be therapeutic. Discussed possibility of biopsies / fulguration of endometriosis, lysis of adhesions. Unilateral oophorectomy only if ovary grossly abnormal. Discussed possibility of non-diagnostic results or inability to restore normal anatomy in event of severe adhesive disease. Discussed risks of  infection, bleeding, damage to surrounding structures, need for additional surgical procedures, need for blood transfusion, pe/vte, anesthesia complications.  Consent signed--all questions answered   [facility] GEFFEL [facility]

## 2017-03-11 ENCOUNTER — Ambulatory Visit (HOSPITAL_COMMUNITY): Payer: 59 | Admitting: Registered Nurse

## 2017-03-11 ENCOUNTER — Ambulatory Visit (HOSPITAL_COMMUNITY)
Admission: AD | Admit: 2017-03-11 | Discharge: 2017-03-11 | Disposition: A | Discharge status: Home / Self Care | Payer: 59 | Source: Ambulatory Visit | Attending: Obstetrics | Admitting: Obstetrics

## 2017-03-11 ENCOUNTER — Encounter (HOSPITAL_COMMUNITY)
Admission: AD | Disposition: A | Discharge status: Home / Self Care | Payer: Self-pay | Source: Ambulatory Visit | Attending: Obstetrics

## 2017-03-11 ENCOUNTER — Encounter (HOSPITAL_COMMUNITY): Payer: Self-pay | Admitting: Registered Nurse

## 2017-03-11 DIAGNOSIS — Z6836 Body mass index (BMI) 36.0-36.9, adult: Secondary | ICD-10-CM | POA: Diagnosis not present

## 2017-03-11 DIAGNOSIS — K668 Other specified disorders of peritoneum: Secondary | ICD-10-CM | POA: Diagnosis not present

## 2017-03-11 DIAGNOSIS — E669 Obesity, unspecified: Secondary | ICD-10-CM | POA: Diagnosis not present

## 2017-03-11 DIAGNOSIS — N8312 Corpus luteum cyst of left ovary: Secondary | ICD-10-CM | POA: Insufficient documentation

## 2017-03-11 DIAGNOSIS — R102 Pelvic and perineal pain: Secondary | ICD-10-CM | POA: Diagnosis not present

## 2017-03-11 DIAGNOSIS — N838 Other noninflammatory disorders of ovary, fallopian tube and broad ligament: Secondary | ICD-10-CM | POA: Insufficient documentation

## 2017-03-11 HISTORY — PX: LAPAROSCOPY: SHX197

## 2017-03-11 HISTORY — PX: BILATERAL SALPINGECTOMY: SHX5743

## 2017-03-11 LAB — PREGNANCY, URINE: PREG TEST UR: NEGATIVE

## 2017-03-11 SURGERY — LAPAROSCOPY OPERATIVE
Anesthesia: General | Site: Abdomen

## 2017-03-11 MED ORDER — SCOPOLAMINE 1 MG/3DAYS TD PT72
1.0000 | MEDICATED_PATCH | TRANSDERMAL | Status: DC
  Administered 2017-03-11: 1.5 mg via TRANSDERMAL

## 2017-03-11 MED ORDER — LACTATED RINGERS IV SOLN
INTRAVENOUS | Status: DC
  Administered 2017-03-11 (×2): via INTRAVENOUS

## 2017-03-11 MED ORDER — SUGAMMADEX SODIUM 200 MG/2ML IV SOLN
INTRAVENOUS | Status: AC
  Filled 2017-03-11: qty 2

## 2017-03-11 MED ORDER — ROCURONIUM BROMIDE 10 MG/ML (PF) SYRINGE
PREFILLED_SYRINGE | INTRAVENOUS | Status: DC | PRN
  Administered 2017-03-11: 40 mg via INTRAVENOUS

## 2017-03-11 MED ORDER — LIDOCAINE 2% (20 MG/ML) 5 ML SYRINGE
INTRAMUSCULAR | Status: DC | PRN
  Administered 2017-03-11: 100 mg via INTRAVENOUS

## 2017-03-11 MED ORDER — DEXAMETHASONE SODIUM PHOSPHATE 10 MG/ML IJ SOLN
INTRAMUSCULAR | Status: DC | PRN
  Administered 2017-03-11: 10 mg via INTRAVENOUS

## 2017-03-11 MED ORDER — BUPIVACAINE HCL (PF) 0.25 % IJ SOLN
INTRAMUSCULAR | Status: AC
  Filled 2017-03-11: qty 30

## 2017-03-11 MED ORDER — OXYCODONE HCL 5 MG PO TABS: 5.0000 mg | ORAL_TABLET | Freq: Four times a day (QID) | ORAL | 0 refills | Status: DC | PRN

## 2017-03-11 MED ORDER — PROPOFOL 10 MG/ML IV BOLUS
INTRAVENOUS | Status: AC
  Filled 2017-03-11: qty 20

## 2017-03-11 MED ORDER — PROPOFOL 500 MG/50ML IV EMUL
INTRAVENOUS | Status: AC
  Filled 2017-03-11: qty 50

## 2017-03-11 MED ORDER — ONDANSETRON HCL 4 MG/2ML IJ SOLN
INTRAMUSCULAR | Status: AC
  Filled 2017-03-11: qty 2

## 2017-03-11 MED ORDER — LIDOCAINE HCL (CARDIAC) 20 MG/ML IV SOLN
INTRAVENOUS | Status: AC
  Filled 2017-03-11: qty 5

## 2017-03-11 MED ORDER — SODIUM CHLORIDE 0.9 % IR SOLN
Status: DC | PRN
  Administered 2017-03-11: 3000 mL

## 2017-03-11 MED ORDER — PROPOFOL 10 MG/ML IV BOLUS
INTRAVENOUS | Status: DC | PRN
  Administered 2017-03-11: 150 mg via INTRAVENOUS

## 2017-03-11 MED ORDER — ONDANSETRON HCL 4 MG/2ML IJ SOLN
INTRAMUSCULAR | Status: DC | PRN
  Administered 2017-03-11: 4 mg via INTRAVENOUS

## 2017-03-11 MED ORDER — DEXAMETHASONE SODIUM PHOSPHATE 10 MG/ML IJ SOLN
INTRAMUSCULAR | Status: AC
  Filled 2017-03-11: qty 1

## 2017-03-11 MED ORDER — FENTANYL CITRATE (PF) 100 MCG/2ML IJ SOLN
INTRAMUSCULAR | Status: AC
  Filled 2017-03-11: qty 2

## 2017-03-11 MED ORDER — ROCURONIUM BROMIDE 100 MG/10ML IV SOLN
INTRAVENOUS | Status: AC
  Filled 2017-03-11: qty 1

## 2017-03-11 MED ORDER — FENTANYL CITRATE (PF) 250 MCG/5ML IJ SOLN
INTRAMUSCULAR | Status: AC
  Filled 2017-03-11: qty 5

## 2017-03-11 MED ORDER — LACTATED RINGERS IV SOLN: INTRAVENOUS | Status: DC

## 2017-03-11 MED ORDER — FENTANYL CITRATE (PF) 250 MCG/5ML IJ SOLN
INTRAMUSCULAR | Status: DC | PRN
  Administered 2017-03-11 (×2): 50 ug via INTRAVENOUS
  Administered 2017-03-11: 100 ug via INTRAVENOUS
  Administered 2017-03-11: 50 ug via INTRAVENOUS

## 2017-03-11 MED ORDER — SCOPOLAMINE 1 MG/3DAYS TD PT72
MEDICATED_PATCH | TRANSDERMAL | Status: AC
  Administered 2017-03-11: 1.5 mg via TRANSDERMAL
  Filled 2017-03-11: qty 1

## 2017-03-11 MED ORDER — MIDAZOLAM HCL 5 MG/5ML IJ SOLN
INTRAMUSCULAR | Status: DC | PRN
  Administered 2017-03-11: 2 mg via INTRAVENOUS

## 2017-03-11 MED ORDER — MIDAZOLAM HCL 2 MG/2ML IJ SOLN
INTRAMUSCULAR | Status: AC
  Filled 2017-03-11: qty 2

## 2017-03-11 MED ORDER — IBUPROFEN 600 MG PO TABS: 600.0000 mg | ORAL_TABLET | Freq: Four times a day (QID) | ORAL | 0 refills | Status: DC | PRN

## 2017-03-11 MED ORDER — SUGAMMADEX SODIUM 200 MG/2ML IV SOLN
INTRAVENOUS | Status: DC | PRN
  Administered 2017-03-11: 200 mg via INTRAVENOUS

## 2017-03-11 MED ORDER — BUPIVACAINE HCL (PF) 0.25 % IJ SOLN
INTRAMUSCULAR | Status: DC | PRN
  Administered 2017-03-11: 9 mL

## 2017-03-11 MED ORDER — PROMETHAZINE HCL 25 MG/ML IJ SOLN: 6.2500 mg | INTRAMUSCULAR | Status: DC | PRN

## 2017-03-11 MED ORDER — PROPOFOL 500 MG/50ML IV EMUL
INTRAVENOUS | Status: DC | PRN
  Administered 2017-03-11: 125 ug/kg/min via INTRAVENOUS

## 2017-03-11 MED ORDER — FENTANYL CITRATE (PF) 100 MCG/2ML IJ SOLN
25.0000 ug | INTRAMUSCULAR | Status: DC | PRN
  Administered 2017-03-11: 50 ug via INTRAVENOUS

## 2017-03-11 SURGICAL SUPPLY — 31 items
ADH SKN CLS APL DERMABOND .7 (GAUZE/BANDAGES/DRESSINGS) ×2
BAG SPEC RTRVL LRG 6X4 10 (ENDOMECHANICALS) ×2
CABLE HIGH FREQUENCY MONO STRZ (ELECTRODE) ×2 IMPLANT
DERMABOND ADVANCED (GAUZE/BANDAGES/DRESSINGS) ×2
DERMABOND ADVANCED .7 DNX12 (GAUZE/BANDAGES/DRESSINGS) ×2 IMPLANT
DRSG TELFA 3X8 NADH (GAUZE/BANDAGES/DRESSINGS) ×4 IMPLANT
GLOVE BIOGEL PI IND STRL 6.5 (GLOVE) ×4 IMPLANT
GLOVE BIOGEL PI IND STRL 7.0 (GLOVE) ×4 IMPLANT
GLOVE BIOGEL PI INDICATOR 6.5 (GLOVE) ×4
GLOVE BIOGEL PI INDICATOR 7.0 (GLOVE) ×4
GLOVE ECLIPSE 6.0 STRL STRAW (GLOVE) ×4 IMPLANT
GOWN STRL REUS W/TWL LRG LVL3 (GOWN DISPOSABLE) ×8 IMPLANT
LIGASURE VESSEL 5MM BLUNT TIP (ELECTROSURGICAL) ×3 IMPLANT
NS IRRIG 1000ML POUR BTL (IV SOLUTION) ×4 IMPLANT
PACK LAPAROSCOPY BASIN (CUSTOM PROCEDURE TRAY) ×4 IMPLANT
PACK TRENDGUARD 450 HYBRID PRO (MISCELLANEOUS) IMPLANT
PACK TRENDGUARD 600 HYBRD PROC (MISCELLANEOUS) IMPLANT
PAD DRESSING TELFA 3X8 NADH (GAUZE/BANDAGES/DRESSINGS) IMPLANT
POUCH SPECIMEN RETRIEVAL 10MM (ENDOMECHANICALS) ×3 IMPLANT
PROTECTOR NERVE ULNAR (MISCELLANEOUS) ×8 IMPLANT
SET IRRIG TUBING LAPAROSCOPIC (IRRIGATION / IRRIGATOR) ×4 IMPLANT
SLEEVE XCEL OPT CAN 5 100 (ENDOMECHANICALS) ×4 IMPLANT
SUT MNCRL AB 3-0 PS2 27 (SUTURE) ×4 IMPLANT
SUT VICRYL 0 UR6 27IN ABS (SUTURE) ×2 IMPLANT
TOWEL OR 17X24 6PK STRL BLUE (TOWEL DISPOSABLE) ×8 IMPLANT
TRAY FOLEY CATH SILVER 16FR (SET/KITS/TRAYS/PACK) ×4 IMPLANT
TRENDGUARD 450 HYBRID PRO PACK (MISCELLANEOUS) ×4
TRENDGUARD 600 HYBRID PROC PK (MISCELLANEOUS)
TROCAR XCEL NON-BLD 11X100MML (ENDOMECHANICALS) ×4 IMPLANT
TROCAR XCEL NON-BLD 5MMX100MML (ENDOMECHANICALS) ×4 IMPLANT
WARMER LAPAROSCOPE (MISCELLANEOUS) ×4 IMPLANT

## 2017-03-11 NOTE — Anesthesia Preprocedure Evaluation (Addendum)
Anesthesia Evaluation  Patient identified by MRN, date of birth, ID band Patient awake    Reviewed: Allergy & Precautions, NPO status , Patient's Chart, lab work & pertinent test results  History of Anesthesia Complications (+) PONV and history of anesthetic complications  Airway Mallampati: II  TM Distance: >3 FB Neck ROM: Full    Dental  (+) Teeth Intact, Dental Advisory Given   Pulmonary neg pulmonary ROS,    Pulmonary exam normal breath sounds clear to auscultation       Cardiovascular Normal cardiovascular exam Rhythm:Regular Rate:Normal  Thoracic outlet syndrome   Neuro/Psych  Headaches, PSYCHIATRIC DISORDERS Anxiety    GI/Hepatic negative GI ROS, Neg liver ROS,   Endo/Other  Obesity   Renal/GU negative Renal ROS     Musculoskeletal negative musculoskeletal ROS (+)   Abdominal   Peds  (+) ATTENTION DEFICIT DISORDER WITHOUT HYPERACTIVITY Hematology negative hematology ROS (+)   Anesthesia Other Findings Day of surgery medications reviewed with the patient.  Reproductive/Obstetrics                             Anesthesia Physical Anesthesia Plan  ASA: II  Anesthesia Plan: General   Post-op Pain Management:    Induction: Intravenous  PONV Risk Score and Plan: 4 or greater and Scopolamine patch - Pre-op, Midazolam, Propofol infusion, Dexamethasone, Ondansetron and TIVA  Airway Management Planned: Oral ETT  Additional Equipment:   Intra-op Plan:   Post-operative Plan: Extubation in OR  Informed Consent: I have reviewed the patients History and Physical, chart, labs and discussed the procedure including the risks, benefits and alternatives for the proposed anesthesia with the patient or authorized representative who has indicated his/her understanding and acceptance.   Dental advisory given  Plan Discussed with: CRNA  Anesthesia Plan Comments: (Risks/benefits of general  anesthesia discussed with patient including risk of damage to teeth, lips, gum, and tongue, nausea/vomiting, allergic reactions to medications, and the possibility of heart attack, stroke and death.  All patient questions answered.  Patient wishes to proceed.  History of PONV, will proceed with GA and TIVA!)       Anesthesia Quick Evaluation

## 2017-03-11 NOTE — Brief Op Note (Signed)
03/11/2017  1:16 PM  PATIENT:  Andrea Gilbert  41 y.o. female  PRE-OPERATIVE DIAGNOSIS:  pelvic pain  POST-OPERATIVE DIAGNOSIS:  pelvic pain  PROCEDURE:  Procedure(s): LAPAROSCOPY OPERATIVE, PERITONEAL BIOPSY, EXCISION LEFT OVARIAN MASS (N/A) BILATERAL SALPINGECTOMY (Bilateral)  SURGEON:  Surgeon(s) and Role:    Marlow Baars* Edilberto Roosevelt, MD - Primary    * Ilda MoriKaplan, Richard, MD - Assisting   ANESTHESIA:   general  EBL:  20 mL   BLOOD ADMINISTERED:none  DRAINS: none   LOCAL MEDICATIONS USED:  MARCAINE     SPECIMEN:  Source of Specimen:  bilateral fallopian tubes, left ovarian mass, peritoneal biopsy, free floating cystic structure in anterior cul de sac  DISPOSITION OF SPECIMEN:  PATHOLOGY  COUNTS:  YES  TOURNIQUET:  * No tourniquets in log *  DICTATION: .Note written in EPIC  PLAN OF CARE: Discharge to home after PACU  PATIENT DISPOSITION:  PACU - hemodynamically stable.   Delay start of Pharmacological VTE agent (>24hrs) due to surgical blood loss or risk of bleeding: not applicable

## 2017-03-11 NOTE — Discharge Instructions (Signed)
DISCHARGE INSTRUCTIONS: Laparoscopy ° °The following instructions have been prepared to help you care for yourself upon your return home today. ° °Wound care: °• Do not get the incision wet for the first 24 hours. The incision should be kept clean and dry. °• The Band-Aids or dressings may be removed the day after surgery. °• Should the incision become sore, red, and swollen after the first week, check with your doctor. ° °Personal hygiene: °• Shower the day after your procedure. ° °Activity and limitations: °• Do NOT drive or operate any equipment today. °• Do NOT lift anything more than 15 pounds for 2-3 weeks after surgery. °• Do NOT rest in bed all day. °• Walking is encouraged. Walk each day, starting slowly with 5-minute walks 3 or 4 times a day. Slowly increase the length of your walks. °• Walk up and down stairs slowly. °• Do NOT do strenuous activities, such as golfing, playing tennis, bowling, running, biking, weight lifting, gardening, mowing, or vacuuming for 2-4 weeks. Ask your doctor when it is okay to start. ° °Diet: Eat a light meal as desired this evening. You may resume your usual diet tomorrow. ° °Return to work: This is dependent on the type of work you do. For the most part you can return to a desk job within a week of surgery. If you are more active at work, please discuss this with your doctor. ° °What to expect after your surgery: You may have a slight burning sensation when you urinate on the first day. You may have a very small amount of blood in the urine. Expect to have a small amount of vaginal discharge/light bleeding for 1-2 weeks. It is not unusual to have abdominal soreness and bruising for up to 2 weeks. You may be tired and need more rest for about 1 week. You may experience shoulder pain for 24-72 hours. Lying flat in bed may relieve it. ° °Call your doctor for any of the following: °• Develop a fever of 100.4 or greater °• Inability to urinate 6 hours after discharge from  hospital °• Severe pain not relieved by pain medications °• Persistent of heavy bleeding at incision site °• Redness or swelling around incision site after a week °• Increasing nausea or vomiting ° °Patient Signature________________________________________ °Nurse Signature_________________________________________ °Post Anesthesia Home Care Instructions ° °Activity: °Get plenty of rest for the remainder of the day. A responsible individual must stay with you for 24 hours following the procedure.  °For the next 24 hours, DO NOT: °-Drive a car °-Operate machinery °-Drink alcoholic beverages °-Take any medication unless instructed by your physician °-Make any legal decisions or sign important papers. ° °Meals: °Start with liquid foods such as gelatin or soup. Progress to regular foods as tolerated. Avoid greasy, spicy, heavy foods. If nausea and/or vomiting occur, drink only clear liquids until the nausea and/or vomiting subsides. Call your physician if vomiting continues. ° °Special Instructions/Symptoms: °Your throat may feel dry or sore from the anesthesia or the breathing tube placed in your throat during surgery. If this causes discomfort, gargle with warm salt water. The discomfort should disappear within 24 hours. ° °If you had a scopolamine patch placed behind your ear for the management of post- operative nausea and/or vomiting: ° °1. The medication in the patch is effective for 72 hours, after which it should be removed.  Wrap patch in a tissue and discard in the trash. Wash hands thoroughly with soap and water. °2. You may remove the patch   earlier than 72 hours if you experience unpleasant side effects which may include dry mouth, dizziness or visual disturbances. 3. Avoid touching the patch. Wash your hands with soap and water after contact with the patch.   You may wash incision with soap and water.  Do not soak the incisions for 2 weeks (no tub baths or swimming).   No lifting over 10 lbs for 2 weeks.     Do not drive until you are not taking narcotic pain medication AND you can comfortably slam on the brakes.

## 2017-03-11 NOTE — Transfer of Care (Signed)
Immediate Anesthesia Transfer of Care Note  Patient: Vista LawmanChristina M Delsol  Procedure(s) Performed: LAPAROSCOPY OPERATIVE, PERITONEAL BIOPSY, EXCISION LEFT OVARIAN MASS (N/A Abdomen) BILATERAL SALPINGECTOMY (Bilateral Abdomen)  Patient Location: PACU  Anesthesia Type:General  Level of Consciousness: awake, alert  and oriented  Airway & Oxygen Therapy: Patient Spontanous Breathing and Patient connected to nasal cannula oxygen  Post-op Assessment: Report given to RN and Post -op Vital signs reviewed and stable  Post vital signs: Reviewed and stable  Last Vitals:  Vitals:   03/11/17 1100  BP: (!) 150/96  Pulse: 82  Resp: 16  Temp: 36.7 C  SpO2: 99%    Last Pain:  Vitals:   03/11/17 1100  TempSrc: Oral  PainSc: 1       Patients Stated Pain Goal: 3 (03/11/17 1100)  Complications: No apparent anesthesia complications

## 2017-03-11 NOTE — Anesthesia Postprocedure Evaluation (Signed)
Anesthesia Post Note  Patient: Vista LawmanChristina M Birkland  Procedure(s) Performed: LAPAROSCOPY OPERATIVE, PERITONEAL BIOPSY, EXCISION LEFT OVARIAN MASS (N/A Abdomen) BILATERAL SALPINGECTOMY (Bilateral Abdomen)     Patient location during evaluation: PACU Anesthesia Type: General Level of consciousness: awake and alert Pain management: pain level controlled Vital Signs Assessment: post-procedure vital signs reviewed and stable Respiratory status: spontaneous breathing, nonlabored ventilation and respiratory function stable Cardiovascular status: blood pressure returned to baseline and stable Postop Assessment: no apparent nausea or vomiting Anesthetic complications: no    Last Vitals:  Vitals:   03/11/17 1430 03/11/17 1445  BP: 114/73 106/67  Pulse: 80 80  Resp: 10 14  Temp:    SpO2: 94% 94%    Last Pain:  Vitals:   03/11/17 1445  TempSrc:   PainSc: 2    Pain Goal: Patients Stated Pain Goal: 3 (03/11/17 1445)               Cecile HearingStephen Edward Vernecia Umble

## 2017-03-11 NOTE — Op Note (Signed)
PRE-OPERATIVE DIAGNOSIS:  pelvic pain  POST-OPERATIVE DIAGNOSIS:  pelvic pain  PROCEDURE:  Procedure(s): LAPAROSCOPY OPERATIVE, PERITONEAL BIOPSY, EXCISION LEFT OVARIAN MASS (N/A) BILATERAL SALPINGECTOMY (Bilateral)  SURGEON:  Surgeon(s) and Role:    Marlow Baars* Troi Bechtold, MD - Primary    * Ilda MoriKaplan, Richard, MD - Assisting   ANESTHESIA:   general  EBL:  20 mL   BLOOD ADMINISTERED:none  DRAINS: none   LOCAL MEDICATIONS USED:  MARCAINE     SPECIMEN:  Source of Specimen:  bilateral fallopian tubes, left ovarian mass, peritoneal biopsy, free floating cystic structure in anterior cul de sac  DISPOSITION OF SPECIMEN:  PATHOLOGY  OPERATIVE FINDINGS: The uterus was small and anteverted.  There were some filmy adhesions of the sigmoid to the left adnexa.  There were evidence of prior bilateral tubal ligation.  There was a small, solid-appearing 3 cm mass on the left ovary with smooth surface. On the left pelvic sidewall, there were numerous hemosiderin implants that did not appear to be consistent with endometriosis.  In the anterior cul de sac, the was a 4 cm smooth, pale cystic structure that was free floating.  It was filled with serous fluid.    DESCRIPTION OF PROCEDURE:   The patient was taken to the operating room, where general endotracheal anesthesia was obtained without difficulty. She was placed in the dorsal lithotomy position in [place] stirrups and exam under anesthesia was performed, and a small mobile uterus was appreciated. The patient was prepped and draped in the normal sterile fashion. A Foley catheter was inserted sterilely into the bladder. A bivalve speculum was placed into the vagina and acorn uterine manipulator was placed without difficulty. Attention was then turned to the abdomen. The scalp was used to make a 10 mm vertical incision at the base of the umbilicus. The 10/11 mm Optiview trocar was used to enter the abdomen under direct visualization. Entry was  confirmed and the abdomen was insufflated with carbon dioxide. The abdomen was surveyed.  The uterus was small and anteverted.  There were some filmy adhesions of the sigmoid to the left adnexa.  There were evidence of prior bilateral tubal ligation.  There was a small, solid-appearing 3 cm mass on the left ovary with smooth surface. On the left pelvic sidewall, there were numerous hemosiderin implants that did not appear to be consistent with endometriosis.  In the anterior cul de sac, the was a 4 cm smooth, pale cystic structure that was free floating.  It was filled with serous fluid.    The LigaSure was then introduced into the abdomen. The right ureter was identified. Starting at the cornua, the LigaSure was used to clamp, doubly burn and separate the fallopian tube from the mesosalpinx.  There was less than 1 cm of tube at the cornua that was cauterized with the LigaSure as it was too small to be transected. Sharp dissection was used to free the sigmoid from the left adnexa.  The LigaSure was then used to separate the left fallopian tube from the ovary and mesosalpinx in a similar manner.  There was also a small cornual remnant left on this side from the prior tubal ligation.  This was cauterized at the level of the uterus. The solid ovarian cyst was transected from its ovarian attachment by the LigaSure.  The small amount of bleeding at the ovary was controlled by electrocautery.  The site of  attachment of the cyst to the ovary was irrigated and was hemostatic. The laparoscopic scissors were  used to take a biopsy of the peritoneum on the right side wall that was a possible endometriotic implant vs hemosiderin deposit.     The endocatch device was introduced into the abdomen.  The bilateral tubes, ovarian cyst, and free floating intraabdominal cystic structure were placed into the bag.  The bag and specimens were removed from the abdomen.  The abdomen was surveyed and all sites were hemostatic.  At this  time, all trocars were removed from the abdomen.  The fascia was closed with 0-vicryl with a figure of eight stitch.  The skin incisions were closed with Monocryl in a running fashion.  All instruments were removed from the vagina.  The patient tolerated all portions of procedure well. Sponge, lap, and needle count were correct x2.

## 2017-03-11 NOTE — Anesthesia Procedure Notes (Signed)
Procedure Name: Intubation Date/Time: 03/11/2017 12:02 PM Performed by: Talbot Grumbling, CRNA Pre-anesthesia Checklist: Patient identified, Emergency Drugs available, Suction available and Patient being monitored Patient Re-evaluated:Patient Re-evaluated prior to induction Oxygen Delivery Method: Circle system utilized Preoxygenation: Pre-oxygenation with 100% oxygen Induction Type: IV induction Ventilation: Mask ventilation without difficulty Laryngoscope Size: Mac and 3 Grade View: Grade III Tube type: Oral Tube size: 7.0 mm Number of attempts: 1 Airway Equipment and Method: Stylet Placement Confirmation: ETT inserted through vocal cords under direct vision,  positive ETCO2 and breath sounds checked- equal and bilateral Secured at: 22 cm Tube secured with: Tape Dental Injury: Teeth and Oropharynx as per pre-operative assessment

## 2017-03-12 ENCOUNTER — Encounter (HOSPITAL_COMMUNITY): Payer: Self-pay | Admitting: Obstetrics

## 2017-03-15 HISTORY — PX: INGUINAL HERNIA REPAIR: SHX194

## 2017-05-12 ENCOUNTER — Telehealth: Payer: Self-pay | Admitting: Cardiovascular Disease

## 2017-05-12 DIAGNOSIS — R918 Other nonspecific abnormal finding of lung field: Secondary | ICD-10-CM

## 2017-05-12 NOTE — Telephone Encounter (Signed)
Just schedule a non contrast chest CT for lung nodule f/u

## 2017-05-12 NOTE — Telephone Encounter (Signed)
New message    Patient calling to request order for yearly  ct scan.

## 2017-05-12 NOTE — Telephone Encounter (Signed)
Called patient back. Patient had a calcium score last year that showed some lung nodules. Patient was wondering about getting a CT for lung nodules now that it's been a year later to see if there has been any changes. Patient is concerned due to her father passing away last year from lung cancer. Patient stated she will be seeing her PCP tomorrow and she will ask her PCP about repeat CT. Patient is also due for a yearly office visit. Patient stated she is doing fine at this time, and states she does not think she needs an office visit at this time.  Will send message to Dr. Eden EmmsNishan for advisement.

## 2017-05-13 DIAGNOSIS — L918 Other hypertrophic disorders of the skin: Secondary | ICD-10-CM | POA: Diagnosis not present

## 2017-05-13 NOTE — Telephone Encounter (Signed)
Called patient to inform her of Dr. Fabio BeringNishan's recommendations. Will send message to scheduling to help patient with appointment times.

## 2017-05-26 ENCOUNTER — Ambulatory Visit (INDEPENDENT_AMBULATORY_CARE_PROVIDER_SITE_OTHER)
Admission: RE | Admit: 2017-05-26 | Discharge: 2017-05-26 | Disposition: A | Discharge status: Home / Self Care | Payer: 59 | Source: Ambulatory Visit | Attending: Cardiovascular Disease | Admitting: Cardiovascular Disease

## 2017-05-26 DIAGNOSIS — R918 Other nonspecific abnormal finding of lung field: Secondary | ICD-10-CM | POA: Diagnosis not present

## 2017-05-27 DIAGNOSIS — M545 Low back pain: Secondary | ICD-10-CM | POA: Diagnosis not present

## 2017-05-27 DIAGNOSIS — M25551 Pain in right hip: Secondary | ICD-10-CM | POA: Diagnosis not present

## 2017-06-02 ENCOUNTER — Other Ambulatory Visit: Payer: Self-pay | Admitting: Diagnostic Radiology

## 2017-06-02 DIAGNOSIS — Z1231 Encounter for screening mammogram for malignant neoplasm of breast: Secondary | ICD-10-CM

## 2017-06-22 ENCOUNTER — Ambulatory Visit
Admission: RE | Admit: 2017-06-22 | Discharge: 2017-06-22 | Disposition: A | Discharge status: Home / Self Care | Payer: 59 | Source: Ambulatory Visit | Attending: Diagnostic Radiology | Admitting: Diagnostic Radiology

## 2017-06-22 DIAGNOSIS — Z1231 Encounter for screening mammogram for malignant neoplasm of breast: Secondary | ICD-10-CM

## 2017-06-26 DIAGNOSIS — J069 Acute upper respiratory infection, unspecified: Secondary | ICD-10-CM | POA: Diagnosis not present

## 2017-06-28 DIAGNOSIS — J3489 Other specified disorders of nose and nasal sinuses: Secondary | ICD-10-CM | POA: Diagnosis not present

## 2017-06-28 DIAGNOSIS — J069 Acute upper respiratory infection, unspecified: Secondary | ICD-10-CM | POA: Diagnosis not present

## 2017-06-28 DIAGNOSIS — R05 Cough: Secondary | ICD-10-CM | POA: Diagnosis not present

## 2017-07-21 ENCOUNTER — Other Ambulatory Visit: Payer: Self-pay | Admitting: Family Medicine

## 2017-07-21 ENCOUNTER — Ambulatory Visit
Admission: RE | Admit: 2017-07-21 | Discharge: 2017-07-21 | Disposition: A | Discharge status: Home / Self Care | Payer: 59 | Source: Ambulatory Visit | Attending: Family Medicine | Admitting: Family Medicine

## 2017-07-21 DIAGNOSIS — R079 Chest pain, unspecified: Secondary | ICD-10-CM

## 2017-07-21 DIAGNOSIS — R05 Cough: Secondary | ICD-10-CM

## 2017-07-21 DIAGNOSIS — R059 Cough, unspecified: Secondary | ICD-10-CM

## 2017-07-21 DIAGNOSIS — M549 Dorsalgia, unspecified: Secondary | ICD-10-CM | POA: Diagnosis not present

## 2017-09-07 DIAGNOSIS — H40053 Ocular hypertension, bilateral: Secondary | ICD-10-CM | POA: Diagnosis not present

## 2017-10-13 DIAGNOSIS — H04129 Dry eye syndrome of unspecified lacrimal gland: Secondary | ICD-10-CM | POA: Diagnosis not present

## 2017-10-28 DIAGNOSIS — R300 Dysuria: Secondary | ICD-10-CM | POA: Diagnosis not present

## 2017-10-28 DIAGNOSIS — R350 Frequency of micturition: Secondary | ICD-10-CM | POA: Diagnosis not present

## 2017-11-23 DIAGNOSIS — M25551 Pain in right hip: Secondary | ICD-10-CM | POA: Diagnosis not present

## 2017-11-23 DIAGNOSIS — M545 Low back pain: Secondary | ICD-10-CM | POA: Diagnosis not present

## 2017-11-28 DIAGNOSIS — M545 Low back pain: Secondary | ICD-10-CM | POA: Diagnosis not present

## 2017-11-28 DIAGNOSIS — M25551 Pain in right hip: Secondary | ICD-10-CM | POA: Diagnosis not present

## 2017-12-05 ENCOUNTER — Ambulatory Visit (INDEPENDENT_AMBULATORY_CARE_PROVIDER_SITE_OTHER): Payer: 59 | Admitting: Family Medicine

## 2017-12-05 ENCOUNTER — Encounter (INDEPENDENT_AMBULATORY_CARE_PROVIDER_SITE_OTHER): Payer: Self-pay | Admitting: Family Medicine

## 2017-12-05 VITALS — BP 183/111 | HR 108

## 2017-12-05 DIAGNOSIS — G8929 Other chronic pain: Secondary | ICD-10-CM | POA: Diagnosis not present

## 2017-12-05 DIAGNOSIS — M25551 Pain in right hip: Secondary | ICD-10-CM | POA: Diagnosis not present

## 2017-12-05 NOTE — Progress Notes (Signed)
Office Visit Note   Patient: Andrea Gilbert           Date of Birth: 09-Apr-1975           MRN: [id number] Visit Date: 12/05/2017 Requested by: Blair HeysEhinger, Robert, MD [address] [place], [place] [address] PCP: Blair HeysEhinger, Robert, MD  Subjective: Chief Complaint  Patient presents with  . Pelvis - Pain    HPI: She is a 42 year old seen at the request of Kathlyn SacramentoLogan Barbour, DPT, for chronic right hip pain.  Symptoms started about 2 years ago, no known injury.  She started feeling intermittent pain in the groin area.  It has gotten steadily worse to the point that it now radiates to her right lower back.  The pain is fairly constant.  She has done physical therapy with temporary relief.  She has had an MRI of her pelvis which was unremarkable, a CT scan of the pelvis, lumbar x-rays which were negative, and she even had laparoscopic surgery which was unrevealing.  She is extremely frustrated with her ongoing symptoms.  She feels a subcutaneous nodule in that area and presumed it was a lipoma, it does not seem to be the source of her pain.  Her physical therapist recently did dry needling and based on location of symptoms, was concern for a possible sports hernia.  No family history of rheumatologic disease.  She is otherwise in good health.              ROS: All other systems were negative.  Objective: Vital Signs: BP (!) 183/111   Pulse (!) 108   Physical Exam:  Female chaperone was present for the examination. Right hip: She has tenderness near the right sacroiliac joint.  No visible rash on her skin.  Good range of motion of the right hip compared to the left but she does have some pain with passive flexion and internal rotation.  There is a subcutaneous nodule consistent with a lipoma.  This does not reproduce her pain.  I do not detect any bulge with Valsalva maneuver.  She is tender to palpation near the pubic symphysis, but that does not reproduce her  pain.  Imaging: Musculoskeletal ultrasound: She has a lipoma in the subcutaneous tissue measuring up measuring about 2 cm.  I was not able to visualize a definite hernia, but exam was somewhat limited due to body habitus.  Assessment & Plan: 1.  Chronic right hip pain, etiology uncertain.  Cannot rule out hernia.  Another consideration would be upper lumbar disc protrusion. -We will order some labs and a lumbar MRI scan.  If unrevealing, it might be worthwhile to consult with a general surgeon who performs sports hernia surgeries.   Follow-Up Instructions: No follow-ups on file.       Procedures: None today.   PMFS History: There are no active problems to display for this patient.  Past Medical History:  Diagnosis Date  . ADD (attention deficit disorder) 09/2009  . IBS (irritable bowel syndrome)   . Migraine    last one 01/2017, otc med prn  . Multiple food allergies   . PONV (postoperative nausea and vomiting)   . Stress reaction 2010  . Thoracic outlet syndrome     Family History  Problem Relation Age of Onset  . Cancer Father        KIDNEY  . Diabetes Father   . CAD Father   . Diabetes Paternal Grandfather     Past Surgical  History:  Procedure Laterality Date  . BILATERAL SALPINGECTOMY Bilateral 03/11/2017   Procedure: BILATERAL SALPINGECTOMY;  Surgeon: Marlow Baars, MD;  Location: [facility];  Service: Gynecology;  Laterality: Bilateral;  . CESAREAN SECTION  1994, 2000   x 2  . CHOLECYSTECTOMY    . COLONOSCOPY    . ENDOMETRIAL ABLATION    . GANGLION CYST EXCISION Left    hand  . KNEE ARTHROSCOPY Left    X 2  . LAPAROSCOPY N/A 03/11/2017   Procedure: LAPAROSCOPY OPERATIVE, PERITONEAL BIOPSY, EXCISION LEFT OVARIAN MASS;  Surgeon: Marlow Baars, MD;  Location: [facility];  Service: Gynecology;  Laterality: N/A;  . REFRACTIVE SURGERY Left    Laser surgery Left eye  . TONSILLECTOMY    . TUBAL LIGATION    . UPPER GI ENDOSCOPY     Social History   Occupational  History  . Not on file  Tobacco Use  . Smoking status: Never Smoker  . Smokeless tobacco: Never Used  Substance and Sexual Activity  . Alcohol use: Yes    Comment: occasional  . Drug use: No  . Sexual activity: Not on file

## 2017-12-06 LAB — UNLABELED

## 2017-12-07 DIAGNOSIS — M545 Low back pain: Secondary | ICD-10-CM | POA: Diagnosis not present

## 2017-12-07 DIAGNOSIS — M25551 Pain in right hip: Secondary | ICD-10-CM | POA: Diagnosis not present

## 2017-12-09 LAB — EXTRA LAV TOP TUBE

## 2017-12-13 ENCOUNTER — Telehealth (INDEPENDENT_AMBULATORY_CARE_PROVIDER_SITE_OTHER): Payer: Self-pay | Admitting: Radiology

## 2017-12-13 NOTE — Telephone Encounter (Signed)
Patient needs labs from 12/05/17 re-drawn.  Andrea Gilbert from [facility] stated samples did not have labels, sent in form but could not read signature on form, therefore time frame for samples were no longer good for testing.

## 2017-12-14 ENCOUNTER — Ambulatory Visit (INDEPENDENT_AMBULATORY_CARE_PROVIDER_SITE_OTHER): Payer: 59 | Admitting: Family Medicine

## 2017-12-14 ENCOUNTER — Other Ambulatory Visit (INDEPENDENT_AMBULATORY_CARE_PROVIDER_SITE_OTHER): Payer: Self-pay | Admitting: Family Medicine

## 2017-12-14 ENCOUNTER — Encounter (INDEPENDENT_AMBULATORY_CARE_PROVIDER_SITE_OTHER): Payer: Self-pay

## 2017-12-14 DIAGNOSIS — M25551 Pain in right hip: Secondary | ICD-10-CM | POA: Diagnosis not present

## 2017-12-14 DIAGNOSIS — M545 Low back pain: Secondary | ICD-10-CM | POA: Diagnosis not present

## 2017-12-14 DIAGNOSIS — G8929 Other chronic pain: Secondary | ICD-10-CM

## 2017-12-14 NOTE — Telephone Encounter (Signed)
Please call patient to come in for redraw, thanks

## 2017-12-14 NOTE — Progress Notes (Signed)
Patient was here for lab draw only.

## 2017-12-14 NOTE — Telephone Encounter (Signed)
Patient had labs re-drawn 12/14/2017.

## 2017-12-15 LAB — ANA: Anti Nuclear Antibody(ANA): NEGATIVE

## 2017-12-15 LAB — RHEUMATOID FACTOR: Rhuematoid fact SerPl-aCnc: <14 IU/mL (ref <14)

## 2017-12-15 LAB — CYCLIC CITRUL PEPTIDE ANTIBODY, IGG: Cyclic Citrullin Peptide Ab: <16 UNITS

## 2017-12-15 LAB — HIGH SENSITIVITY CRP

## 2017-12-15 LAB — VITAMIN D 25 HYDROXY (VIT D DEFICIENCY, FRACTURES): VIT D 25 HYDROXY: 39 ng/mL (ref 30–100)

## 2017-12-16 ENCOUNTER — Telehealth (INDEPENDENT_AMBULATORY_CARE_PROVIDER_SITE_OTHER): Payer: Self-pay | Admitting: Family Medicine

## 2017-12-16 NOTE — Telephone Encounter (Signed)
C-reactive protein, a nonspecific marker of inflammation, is elevated.  But fortunately, all of the other tests were negative/normal.

## 2017-12-18 ENCOUNTER — Ambulatory Visit
Admission: RE | Admit: 2017-12-18 | Discharge: 2017-12-18 | Disposition: A | Discharge status: Home / Self Care | Payer: 59 | Source: Ambulatory Visit | Attending: Family Medicine | Admitting: Family Medicine

## 2017-12-18 DIAGNOSIS — M545 Low back pain: Secondary | ICD-10-CM | POA: Diagnosis not present

## 2017-12-18 DIAGNOSIS — M25551 Pain in right hip: Principal | ICD-10-CM

## 2017-12-18 DIAGNOSIS — G8929 Other chronic pain: Secondary | ICD-10-CM

## 2017-12-19 ENCOUNTER — Other Ambulatory Visit (INDEPENDENT_AMBULATORY_CARE_PROVIDER_SITE_OTHER): Payer: Self-pay | Admitting: Family Medicine

## 2017-12-19 ENCOUNTER — Telehealth (INDEPENDENT_AMBULATORY_CARE_PROVIDER_SITE_OTHER): Payer: Self-pay | Admitting: Family Medicine

## 2017-12-19 DIAGNOSIS — M25551 Pain in right hip: Principal | ICD-10-CM

## 2017-12-19 DIAGNOSIS — G8929 Other chronic pain: Secondary | ICD-10-CM

## 2017-12-19 NOTE — Telephone Encounter (Signed)
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  Will refer to general surgeon for evaluation for possible sports hernia.

## 2017-12-19 NOTE — Progress Notes (Signed)
Done

## 2017-12-23 ENCOUNTER — Telehealth: Payer: Self-pay | Admitting: Cardiovascular Disease

## 2017-12-23 NOTE — Telephone Encounter (Signed)
Left message for patient to call back  

## 2017-12-23 NOTE — Telephone Encounter (Signed)
Patient is wanting to have Dr. Eden Emms review her lab work that Dr. Prince Rome ordered. Patient is concerned with her  Hs-CRP being elevated along with her cardiac history. Informed patient that Dr. Eden Emms is out this week, but will get her message next week when he is back in the office. Patient verbalized understanding. Will forward to Dr. Eden Emms for advisement.

## 2017-12-23 NOTE — Telephone Encounter (Signed)
New Message   Pt states she has been waiting on a call from a nurse to discuss her labs they came back high, states she needs to verify with Dr. Eden Emms because of family history. Please call

## 2017-12-25 NOTE — Telephone Encounter (Signed)
Reviewed labs and CRP elevated but RF/ANA negative would talk to Dr Derry Lory about getting ? ESR or what CRP means in regard to her hip pain. She had a CT chest earlier this year March and no calcium in her coronary arteries Primary can check lipids

## 2017-12-26 NOTE — Telephone Encounter (Signed)
Left message for patient to call back  

## 2017-12-26 NOTE — Telephone Encounter (Signed)
Patient called back. Informed her of Dr. Kyla Balzarine recommendations. Patient verbalized understanding and will check with her PCP about ESR, since she does not see Dr. Derry Lory for 6 months. Patient was appreciative of response and thanked [place] for our help.

## 2018-01-03 ENCOUNTER — Ambulatory Visit: Payer: Self-pay | Admitting: Surgery

## 2018-01-03 DIAGNOSIS — S3981XA Other specified injuries of abdomen, initial encounter: Secondary | ICD-10-CM | POA: Diagnosis not present

## 2018-01-03 DIAGNOSIS — R1084 Generalized abdominal pain: Secondary | ICD-10-CM | POA: Diagnosis not present

## 2018-01-03 NOTE — H&P (Signed)
Andrea Gilbert Documented: 01/03/2018 8:35 AM Location: [facility] Patient #: [id number] DOB: Jun 03, 1975 Married / Language: Andrea Gilbert / Race: White Female  History of Present Illness Andrea Sportsman MD; 01/03/2018 11:26 AM) The patient is a 42 year old female who presents with a complaint of Groin pain. ` ` ` Patient sent for surgical consultation at the request of  Chief Complaint: Right groin to back pain. Question of lipoma. Question of hernia. ` ` The patient is a pleasant woman that is struggled with intermittent pain in the right groin and right back for the past 2 years. There is some question of an adnexal mass. Underwent operative exploration. Some cysts removed but no endometriosis. Had persistent pain. Has had CAT scans and MRIs ruled out any other major abnormalities. She describes the pain is usually starting in the right groin. Radiating to or around towards the back. Worse if she sits for prolonged periods of time or stands up. Pursestring returning. Usually some mild walking activity helps improvement but prolonged Gilbert or prolonged physical activity makes it worse. Heat sometimes help. Over-the-counter medications not as much. She had workup ruling out a hip disease. She's had lumbar MRI that ruled out any disc disease or a slipped disc abnormality. CT scan of abdomen rule out any other major abnormality. This is bowel within within the past year. He was sent for pelvic floor physical therapy. Some improvement but no relief. Came back discuss with her primary care physician. There was concerned perhaps of a lipoma on ultrasound in the office ago none noted on CAT scan or MRI of the pelvis. Surgical consultation requested to see if patient may have sports hernia or symptomatic lipoma.  Patient usually moves her bowels every day. No vaginal bleeding or discharge. No endometriosis. No urinary tract infection. Denies really any hip or  other joint pain. No fevers chills or sweats. No night sweats. Weight has been coming down slightly due to exercise and diet. No unintentional weight loss. She had labs Cholecystectomy by Dr. Ezzard Gilbert with our group many years ago without incident. He had diagnostic laparoscopy as noted above with removal of both fallopian tubes as well as a left ovarian cystic mass and peritoneal mass. All benign cysts. No personal nor family history of GI/colon cancer, inflammatory bowel disease, irritable bowel syndrome, allergy such as Celiac Sprue, dietary/dairy problems, colitis, ulcers nor gastritis. No recent sick contacts/gastroenteritis. No travel outside the country. No changes in diet. No dysphagia to solids or liquids. No significant heartburn or reflux. No hematochezia, hematemesis, coffee ground emesis. No evidence of prior gastric/peptic ulceration.  (Review of systems as stated in this history (HPI) or in the review of systems. Otherwise all other 12 point ROS are negative) ` ` `   Past Surgical History Andrea Gilbert, [place]; 01/03/2018 8:36 AM) Cesarean Section - Multiple Gallbladder Surgery - Laparoscopic Knee Surgery Left. Tonsillectomy  Diagnostic Studies History Andrea Gilbert, [place]; 01/03/2018 8:36 AM) Colonoscopy 5-10 years ago Mammogram within last year Pap Smear 1-5 years ago  Allergies Andrea Gilbert, RMA; 01/03/2018 8:36 AM) Sulfacetamide *CHEMICALS* Citric Acid *CHEMICALS* Allergies Reconciled  Medication History Andrea Gilbert, RMA; 01/03/2018 8:38 AM) Flexeril (10MG  Tablet, Oral as needed) Active. Vyvanse (30MG  Capsule, Oral) Active. Dexilant (60MG  Capsule DR, Oral) Active. Medications Reconciled  Social History Andrea Gilbert, [place]; 01/03/2018 8:36 AM) Alcohol use Occasional alcohol use. Caffeine use Coffee, Tea. No drug use Tobacco use Never smoker.  Family History Andrea Gilbert, [place]; 01/03/2018 8:36 AM) Alcohol Abuse  Father. Cancer Father. Cerebrovascular Accident Family Members In General. Colon Polyps Brother, Father, Mother. Diabetes Mellitus Family Members In General. Heart Disease Father. Heart disease in female family member before age 31 Hypertension Brother, Father, Mother. Kidney Disease Father. Melanoma Mother. Respiratory Condition Father.  Pregnancy / Birth History Andrea Gilbert, [place]; 01/03/2018 8:36 AM) Age at menarche 11 years. Contraceptive History Oral contraceptives. Gravida 2 Length (months) of breastfeeding 3-6 Maternal age 47-20 Para 2 Regular periods  Other Problems Andrea Gilbert, [place]; 01/03/2018 8:36 AM) Anxiety Disorder Back Pain Gastroesophageal Reflux Disease     Review of Systems Andrea Gilbert RMA; 01/03/2018 8:36 AM) General Present- Fatigue. Not Present- Appetite Loss, Chills, Fever, Night Sweats, Weight Gain and Weight Loss. Skin Present- Dryness. Not Present- Change in Wart/Mole, Hives, Jaundice, New Lesions, Non-Healing Wounds, Rash and Ulcer. HEENT Present- Wears glasses/contact lenses. Not Present- Earache, Hearing Loss, Hoarseness, Nose Bleed, Oral Ulcers, Ringing in the Ears, Seasonal Allergies, Sinus Pain, Sore Throat, Visual Disturbances and Yellow Eyes. Respiratory Not Present- Bloody sputum, Chronic Cough, Difficulty Breathing, Snoring and Wheezing. Breast Not Present- Breast Mass, Breast Pain, Nipple Discharge and Skin Changes. Cardiovascular Present- Leg Cramps. Not Present- Chest Pain, Difficulty Breathing Lying Down, Palpitations, Rapid Heart Rate, Shortness of Breath and Swelling of Extremities. Gastrointestinal Not Present- Abdominal Pain, Bloating, Bloody Stool, Change in Bowel Habits, Chronic diarrhea, Constipation, Difficulty Swallowing, Excessive gas, Gets full quickly at meals, Hemorrhoids, Indigestion, Nausea, Rectal Pain and Vomiting. Female Genitourinary Present- Pelvic Pain. Not Present- Frequency, Nocturia, Painful  Urination and Urgency. Musculoskeletal Present- Back Pain, Joint Pain, Joint Stiffness and Muscle Pain. Not Present- Muscle Weakness and Swelling of Extremities. Neurological Not Present- Decreased Memory, Fainting, Headaches, Numbness, Seizures, Tingling, Tremor, Trouble walking and Weakness. Psychiatric Not Present- Anxiety, Bipolar, Change in Sleep Pattern, Depression, Fearful and Frequent crying. Endocrine Present- Cold Intolerance and Hair Changes. Not Present- Excessive Hunger, Heat Intolerance, Hot flashes and New Diabetes. Hematology Not Present- Blood Thinners, Easy Bruising, Excessive bleeding, Gland problems, HIV and Persistent Infections.  Vitals Andrea Gilbert RMA; 01/03/2018 8:36 AM) 01/03/2018 8:36 AM Weight: 201 lb Height: 62in Body Surface Area: 1.92 m Body Mass Index: 36.76 kg/m  Temp.: 98.5F  Pulse: 98 (Regular)  BP: 135/82 (Sitting, Left Arm, Standard)      Physical Exam Andrea Sportsman MD; 01/03/2018 11:26 AM)  General Mental Status-Alert. General Appearance-Not in acute distress, Not Sickly. Orientation-Oriented X3. Hydration-Well hydrated. Voice-Normal.  Integumentary Global Assessment Upon inspection and palpation of skin surfaces of the - Axillae: non-tender, no inflammation or ulceration, no drainage. and Distribution of scalp and body hair is normal. General Characteristics Temperature - normal warmth is noted.  Head and Neck Head-normocephalic, atraumatic with no lesions or palpable masses. Face Global Assessment - atraumatic, no absence of expression. Neck Global Assessment - no abnormal movements, no bruit auscultated on the right, no bruit auscultated on the left, no decreased range of motion, non-tender. Trachea-midline. Thyroid Gland Characteristics - non-tender.  Eye Eyeball - Left-Extraocular movements intact, No Nystagmus. Eyeball - Right-Extraocular movements intact, No Nystagmus. Cornea - Left-No  Hazy. Cornea - Right-No Hazy. Sclera/Conjunctiva - Left-No scleral icterus, No Discharge. Sclera/Conjunctiva - Right-No scleral icterus, No Discharge. Pupil - Left-Direct reaction to light normal. Pupil - Right-Direct reaction to light normal.  ENMT Ears Pinna - Left - no drainage observed, no generalized tenderness observed. Right - no drainage observed, no generalized tenderness observed. Nose and Sinuses External Inspection of the Nose - no destructive lesion observed. Inspection of the nares - Left -  quiet respiration. Right - quiet respiration. Mouth and Throat Lips - Upper Lip - no fissures observed, no pallor noted. Lower Lip - no fissures observed, no pallor noted. Nasopharynx - no discharge present. Oral Cavity/Oropharynx - Tongue - no dryness observed. Oral Mucosa - no cyanosis observed. Hypopharynx - no evidence of airway distress observed.  Chest and Lung Exam Inspection Movements - Normal and Symmetrical. Accessory muscles - No use of accessory muscles in breathing. Palpation Palpation of the chest reveals - Non-tender. Auscultation Breath sounds - Normal and Clear.  Cardiovascular Auscultation Rhythm - Regular. Murmurs & Other Heart Sounds - Auscultation of the heart reveals - No Murmurs and No Systolic Clicks.  Abdomen Inspection Inspection of the abdomen reveals - No Visible peristalsis and No Abnormal pulsations. Umbilicus - No Bleeding, No Urine drainage. Palpation/Percussion Palpation and Percussion of the abdomen reveal - Soft, Non Tender, No Rebound tenderness, No Rigidity (guarding) and No Cutaneous hyperesthesia. Note: Abdomen soft. Not severely distended. No significant diastases. No umbilical sensitivity. No distasis recti. No umbilical or other anterior abdominal wall hernias  Female Genitourinary Sexual Maturity Tanner 5 - Adult hair pattern. Note: Sensitivity at external ring of right inguinal region. Reproducible with cough. That is  the point of maximal focus tenderness. Perhaps a small impulse but subtle at best. Mild extra subcutaneous tissues on right lateral subtle panniculus over anterior superior iliac spine. Not in the groin/inguinal region. No discrete mass. No discrete adenopathy or subcutaneous masses. Nontender and no obvious impulse on the left side.  No vaginal bleeding nor discharge  Peripheral Vascular Upper Extremity Inspection - Left - No Cyanotic nailbeds, Not Ischemic. Right - No Cyanotic nailbeds, Not Ischemic.  Neurologic Neurologic evaluation reveals -normal attention span and ability to concentrate, able to name objects and repeat phrases. Appropriate fund of knowledge , normal sensation and normal coordination. Mental Status Affect - not angry, not paranoid. Cranial Nerves-Normal Bilaterally. Gait-Normal.  Neuropsychiatric Mental status exam performed with findings of-able to articulate well with normal speech/language, rate, volume and coherence, thought content normal with ability to perform basic computations and apply abstract reasoning and no evidence of hallucinations, delusions, obsessions or homicidal/suicidal ideation.  Musculoskeletal Global Assessment Spine, Ribs and Pelvis - no instability, subluxation or laxity. Right Upper Extremity - no instability, subluxation or laxity.  Lymphatic Head & Neck  General Head & Neck Lymphatics: Bilateral - Description - No Localized lymphadenopathy. Axillary  General Axillary Region: Bilateral - Description - No Localized lymphadenopathy. Femoral & Inguinal  Generalized Femoral & Inguinal Lymphatics: Left - Description - No Localized lymphadenopathy. Right - Description - No Localized lymphadenopathy.    Assessment & Plan Andrea Sportsman MD; 01/03/2018 11:30 AM)  SPORTS HERNIA, INITIAL ENCOUNTER (S39.81XA) Impression: Right groin pain occasionally radiating to the back without any hip nor spinal etiology. Related to physical  activity and most sensitive at the external ring, suspicious for sports hernia or small inguinal hernia.  Given the fact that she's had persistent symptoms despite negative GYN laparoscopy, negative hip issues, lack of improvement with groin physical therapy, no evidence of any spinal issues; I think it is reasonable to offer laparoscopic exploration with at the very least mesh reinforcement for the presumption treatment of a sports type hernia on the right side. I suspect she might actually have a true inguinal hernia there. Nothing giant based on prior CAT scans and MRIs. I do not feel a distinct mass. No strong evidence of a lipoma on CT and MRI which I would tend  to favor over a ultrasound anyway.  It is reasonable to do a trial naproxen and heat constantly for the next 3-6 weeks to see if that helps resolve things. In the meantime try and schedule surgery. She is interested in proceeding.  I did caution there is a chance that this does not resolve the problem and or some other etiology. Because she has done physical therapy and there is no evidence of any GYN, GI, hip or spinal issues; I think the risk is low and acceptable. She wishes to be aggressive and proceed with surgery. We will work to coordinate a convenient time  Current Plans Started Naproxen 500 MG Oral Tablet, 1 (one) Tablet two times daily, #30, 01/03/2018, Ref. x1. Pt Education - [facility] Pain Control (Tanina Barb) You are being scheduled for surgery- Our schedulers will call you.  You should hear from our office's scheduling department within 5 working days about the location, date, and time of surgery. We try to make accommodations for patient's preferences in scheduling surgery, but sometimes the OR schedule or the surgeon's schedule prevents [place] from making those accommodations.  If you have not heard from our office [phone]) in 5 working days, call the office and ask for your surgeon's nurse.  If you have other questions about  your diagnosis, plan, or surgery, call the office and ask for your surgeon's nurse.   PREOP - ING HERNIA - ENCOUNTER FOR PREOPERATIVE EXAMINATION FOR GENERAL SURGICAL PROCEDURE (Z01.818)  Current Plans Written instructions provided The anatomy & physiology of the abdominal wall and pelvic floor was discussed. The pathophysiology of hernias in the inguinal and pelvic region was discussed. Natural history risks such as progressive enlargement, pain, incarceration, and strangulation was discussed. Contributors to complications such as smoking, obesity, diabetes, prior surgery, etc were discussed.  I feel the risks of no intervention will lead to serious problems that outweigh the operative risks; therefore, I recommended surgery to reduce and repair the hernia. I explained laparoscopic techniques with possible need for an open approach. I noted usual use of mesh to patch and/or buttress hernia repair  Risks such as bleeding, infection, abscess, need for further treatment, heart attack, death, and other risks were discussed. I noted a good likelihood this will help address the problem. Goals of post-operative recovery were discussed as well. Possibility that this will not correct all symptoms was explained. I stressed the importance of low-impact activity, aggressive pain control, avoiding constipation, & not pushing through pain to minimize risk of post-operative chronic pain or injury. Possibility of reherniation was discussed. We will work to minimize complications.  An educational handout further explaining the pathology & treatment options was given as well. Questions were answered. The patient expresses understanding & wishes to proceed with surgery.  Pt Education - Pamphlet Given - Laparoscopic Hernia Repair: discussed with patient and provided information. Pt Education - [facility] Hernia Post-Op HCI (Teasia Zapf): discussed with patient and provided information. Pt Education - [facility] Mesh  education: discussed with patient and provided information.  Andrea Sportsman, MD, FACS, MASCRS Gastrointestinal and Minimally Invasive Surgery    [address] [place], [place] [address] [phone] Main / Paging [phone] Fax

## 2018-01-04 DIAGNOSIS — M25559 Pain in unspecified hip: Secondary | ICD-10-CM | POA: Diagnosis not present

## 2018-01-04 DIAGNOSIS — R7982 Elevated C-reactive protein (CRP): Secondary | ICD-10-CM | POA: Diagnosis not present

## 2018-01-20 DIAGNOSIS — Z0184 Encounter for antibody response examination: Secondary | ICD-10-CM | POA: Diagnosis not present

## 2018-02-23 DIAGNOSIS — K4020 Bilateral inguinal hernia, without obstruction or gangrene, not specified as recurrent: Secondary | ICD-10-CM | POA: Diagnosis not present

## 2018-02-23 DIAGNOSIS — K4190 Unilateral femoral hernia, without obstruction or gangrene, not specified as recurrent: Secondary | ICD-10-CM | POA: Diagnosis not present

## 2018-02-23 DIAGNOSIS — K429 Umbilical hernia without obstruction or gangrene: Secondary | ICD-10-CM | POA: Diagnosis not present

## 2018-03-10 DIAGNOSIS — H40013 Open angle with borderline findings, low risk, bilateral: Secondary | ICD-10-CM | POA: Diagnosis not present

## 2018-03-28 NOTE — Progress Notes (Deleted)
Cardiology Office Note   Date:  03/28/2018   ID:  Andrea Gilbert, DOB [birth year], MRN [id number]  PCP:  Blair HeysEhinger Robert  Cardiologist:   Charlton HawsPeter , MD   No chief complaint on file.     History of Present Illness: Andrea Gilbert is a 43 y.o. female who presents for f/u of atypical chest pain  I use to take care of her Dad [name] who had advanced CAD Calcium score done 04/30/16 was 0 Had some lung nodules that were benign And stable on f/u CT 05/26/17 Has had chronic right groin and back pain  Last seen by Dr Michaell CowingGross suspected sports hernia She has had negative spine, GI and OB/Gyn evaluations for source of pain   ***   Past Medical History:  Diagnosis Date  . ADD (attention deficit disorder) 09/2009  . IBS (irritable bowel syndrome)   . Migraine    last one 01/2017, otc med prn  . Multiple food allergies   . PONV (postoperative nausea and vomiting)   . Stress reaction 2010  . Thoracic outlet syndrome     Past Surgical History:  Procedure Laterality Date  . BILATERAL SALPINGECTOMY Bilateral 03/11/2017   Procedure: BILATERAL SALPINGECTOMY;  Surgeon: Marlow Baarslark, Dyanna, MD;  Location: [facility];  Service: Gynecology;  Laterality: Bilateral;  . CESAREAN SECTION  1994, 2000   x 2  . CHOLECYSTECTOMY    . COLONOSCOPY    . ENDOMETRIAL ABLATION    . GANGLION CYST EXCISION Left    hand  . KNEE ARTHROSCOPY Left    X 2  . LAPAROSCOPY N/A 03/11/2017   Procedure: LAPAROSCOPY OPERATIVE, PERITONEAL BIOPSY, EXCISION LEFT OVARIAN MASS;  Surgeon: Marlow Baarslark, Dyanna, MD;  Location: [facility];  Service: Gynecology;  Laterality: N/A;  . REFRACTIVE SURGERY Left    Laser surgery Left eye  . TONSILLECTOMY    . TUBAL LIGATION    . UPPER GI ENDOSCOPY       Current Outpatient Medications  Medication Sig Dispense Refill  . cyclobenzaprine (FLEXERIL) 10 MG tablet Take 10 mg by mouth 3 (three) times daily as needed for muscle spasms.    Marland Kitchen. EPINEPHrine 0.3 mg/0.3 mL IJ SOAJ injection Inject 0.3  mg into the muscle once.    . escitalopram (LEXAPRO) 10 MG tablet Take 10 mg by mouth daily.    Marland Kitchen. ibuprofen (ADVIL,MOTRIN) 600 MG tablet Take 1 tablet (600 mg total) by mouth every 6 (six) hours as needed. 30 tablet 0  . lisdexamfetamine (VYVANSE) 30 MG capsule Take 30 mg by mouth daily as needed (for concentration).     . meclizine (ANTIVERT) 25 MG tablet Take 25 mg by mouth 3 (three) times daily as needed for dizziness.    Marland Kitchen. oxyCODONE (ROXICODONE) 5 MG immediate release tablet Take 1 tablet (5 mg total) by mouth every 6 (six) hours as needed for severe pain. 15 tablet 0   No current facility-administered medications for this visit.     Allergies:   Citric acid; Concerta [methylphenidate hcl er (cd)]; Tomato; Adderall [amphetamine-dextroamphetamine]; Strattera [atomoxetine hcl]; and Sulfa antibiotics    Social History:  The patient  reports that she has never smoked. She has never used smokeless tobacco. She reports current alcohol use. She reports that she does not use drugs.   Family History:  The patient's family history includes CAD in her father; Cancer in her father; Diabetes in her father and paternal grandfather.    ROS:  Please see the history of present illness.  Otherwise, review of systems are positive for none.   All other systems are reviewed and negative.    PHYSICAL EXAM: VS:  There were no vitals taken for this visit. , BMI There is no height or weight on file to calculate BMI. Affect appropriate Healthy:  appears stated age HEENT: normal Neck supple with no adenopathy JVP normal no bruits no thyromegaly Lungs clear with no wheezing and good diaphragmatic motion Heart:  S1/S2 no murmur, no rub, gallop or click PMI normal Abdomen: benighn, BS positve, no tenderness, no AAA no bruit.  No HSM or HJR Distal pulses intact with no bruits No edema Neuro non-focal Skin warm and dry No muscular weakness    EKG:  SR rate 89 normal 04/28/15  CXR:   NAD   Recent  Labs: No results found for requested labs within last 8760 hours.    Lipid Panel No results found for: CHOL, TRIG, HDL, CHOLHDL, VLDL, LDLCALC, LDLDIRECT    Wt Readings from Last 3 Encounters:  03/11/17 201 lb (91.2 kg)  03/04/17 201 lb 4 oz (91.3 kg)  09/21/16 193 lb (87.5 kg)      Other studies Reviewed: Additional studies/ records that were reviewed today include: Notes Dr Richardo Priest ECG and labs .    ASSESSMENT AND PLAN:  1. Chest Pain:  Non cardiac calcium score 0 observe ETT normal 04/30/16  2. GERD: continue dexilant  3. Right Groin pain: ? Sports hernia f/u Dr Michaell Cowing has already done physical Rx   Current medicines are reviewed at length with the patient today.  The patient does not have concerns regarding medicines.  The following changes have been made:  no change  Labs/ tests ordered today include: None   No orders of the defined types were placed in this encounter.    Disposition:   FU with me in a year      Signed, Charlton Haws, MD  03/28/2018 4:04 PM    [facility] [address] [place], [place], [place]  [address] Phone: [phone]; Fax: [phone]

## 2018-03-31 ENCOUNTER — Ambulatory Visit: Payer: 59 | Admitting: Cardiovascular Disease

## 2018-04-03 NOTE — Progress Notes (Signed)
Cardiology Office Note   Date:  04/06/2018   ID:  Andrea Gilbert, DOB 1975/08/04, MRN [id number]  PCP:  Blair HeysEhinger Robert  Cardiologist:   Charlton HawsPeter , MD   No chief complaint on file.     History of Present Illness: Andrea Gilbert is a 43 y.o. female who presents for f/u of atypical chest pain  I use to take care of her Dad [name] who had advanced CAD Calcium score done 04/30/16 was 0 Had some lung nodules that were benign And stable on f/u CT 05/26/17 Has had chronic right groin and back pain  Last seen by Dr Michaell CowingGross suspected sports hernia She has had negative spine, GI and OB/Gyn evaluations for source of pain   She was rather tachycardic with elevated BP in office today Always gets anxious at doctor's but this is a bit unusual  No palpitations or symptoms at home    Past Medical History:  Diagnosis Date  . ADD (attention deficit disorder) 09/2009  . IBS (irritable bowel syndrome)   . Migraine    last one 01/2017, otc med prn  . Multiple food allergies   . PONV (postoperative nausea and vomiting)   . Stress reaction 2010  . Thoracic outlet syndrome     Past Surgical History:  Procedure Laterality Date  . BILATERAL SALPINGECTOMY Bilateral 03/11/2017   Procedure: BILATERAL SALPINGECTOMY;  Surgeon: Marlow Baarslark, Dyanna, MD;  Location: [facility];  Service: Gynecology;  Laterality: Bilateral;  . CESAREAN SECTION  1994, 2000   x 2  . CHOLECYSTECTOMY    . COLONOSCOPY    . ENDOMETRIAL ABLATION    . GANGLION CYST EXCISION Left    hand  . KNEE ARTHROSCOPY Left    X 2  . LAPAROSCOPY N/A 03/11/2017   Procedure: LAPAROSCOPY OPERATIVE, PERITONEAL BIOPSY, EXCISION LEFT OVARIAN MASS;  Surgeon: Marlow Baarslark, Dyanna, MD;  Location: [facility];  Service: Gynecology;  Laterality: N/A;  . REFRACTIVE SURGERY Left    Laser surgery Left eye  . TONSILLECTOMY    . TUBAL LIGATION    . UPPER GI ENDOSCOPY       Current Outpatient Medications  Medication Sig Dispense Refill  . cyclobenzaprine  (FLEXERIL) 10 MG tablet Take 10 mg by mouth 3 (three) times daily as needed for muscle spasms.    Marland Kitchen. EPINEPHrine 0.3 mg/0.3 mL IJ SOAJ injection Inject 0.3 mg into the muscle once.    Marland Kitchen. ibuprofen (ADVIL,MOTRIN) 600 MG tablet Take 1 tablet (600 mg total) by mouth every 6 (six) hours as needed. 30 tablet 0  . lisdexamfetamine (VYVANSE) 30 MG capsule Take 30 mg by mouth daily as needed (for concentration).      No current facility-administered medications for this visit.     Allergies:   Citric acid; Concerta [methylphenidate hcl er (cd)]; Tomato; Adderall [amphetamine-dextroamphetamine]; Strattera [atomoxetine hcl]; and Sulfa antibiotics    Social History:  The patient  reports that she has never smoked. She has never used smokeless tobacco. She reports current alcohol use. She reports that she does not use drugs.   Family History:  The patient's family history includes CAD in her father; Cancer in her father; Diabetes in her father and paternal grandfather.    ROS:  Please see the history of present illness.   Otherwise, review of systems are positive for none.   All other systems are reviewed and negative.    PHYSICAL EXAM: VS:  BP (!) 164/104   Pulse (!) 116   Ht 5'  2" (1.575 m)   Wt 204 lb 12.8 oz (92.9 kg)   SpO2 99%   BMI 37.46 kg/m  , BMI Body mass index is 37.46 kg/m. Affect appropriate Healthy:  appears stated age HEENT: normal Neck supple with no adenopathy JVP normal no bruits no thyromegaly Lungs clear with no wheezing and good diaphragmatic motion Heart:  S1/S2 no murmur, no rub, gallop or click PMI normal Abdomen: benighn, BS positve, no tenderness, no AAA no bruit.  No HSM or HJR Distal pulses intact with no bruits No edema Neuro non-focal Skin warm and dry No muscular weakness    EKG:  04/06/18 SR rate 116 voltage LVH in limb leads  CXR:   NAD   Recent Labs: No results found for requested labs within last 8760 hours.    Lipid Panel No results found  for: CHOL, TRIG, HDL, CHOLHDL, VLDL, LDLCALC, LDLDIRECT    Wt Readings from Last 3 Encounters:  04/06/18 204 lb 12.8 oz (92.9 kg)  03/11/17 201 lb (91.2 kg)  03/04/17 201 lb 4 oz (91.3 kg)      Other studies Reviewed: Additional studies/ records that were reviewed today include: Notes Dr Richardo PriestEhnger ECG and labs .    ASSESSMENT AND PLAN:  1. Chest Pain:  Non cardiac calcium score 0 observe ETT normal 04/30/16  2. GERD: continue dexilant  3. Right Groin Pain: ? Sports hernia f/u Dr Michaell CowingGross has already done physical Rx 4. HR/BP   Both quite elevated in office today she has white coat anxiety. She will Get BP cuff and monitor Check TSH/T4 Hct today She does not take Vivanse often  Current medicines are reviewed at length with the patient today.  The patient does not have concerns regarding medicines.  The following changes have been made:  no change  Labs/ tests ordered today include: None   Orders Placed This Encounter  Procedures  . Hematocrit  . Hemoglobin  . T4, free  . TSH  . EKG 12-Lead     Disposition:   FU with me in a year      Signed, Charlton HawsPeter , MD  04/06/2018 11:14 AM    [facility] [address], [place], [place]  [address] Phone: [phone]; Fax: [phone]

## 2018-04-06 ENCOUNTER — Encounter: Payer: Self-pay | Admitting: Cardiovascular Disease

## 2018-04-06 ENCOUNTER — Ambulatory Visit (INDEPENDENT_AMBULATORY_CARE_PROVIDER_SITE_OTHER): Payer: 59 | Admitting: Cardiovascular Disease

## 2018-04-06 VITALS — BP 164/104 | HR 116 | Ht 62.0 in | Wt 204.8 lb

## 2018-04-06 DIAGNOSIS — R000 Tachycardia, unspecified: Secondary | ICD-10-CM

## 2018-04-06 DIAGNOSIS — R079 Chest pain, unspecified: Secondary | ICD-10-CM | POA: Diagnosis not present

## 2018-04-06 LAB — TSH: TSH: 2.48 u[IU]/mL (ref 0.450–4.500)

## 2018-04-06 LAB — HEMATOCRIT: Hematocrit: 40.6 % (ref 34.0–46.6)

## 2018-04-06 LAB — HEMOGLOBIN: Hemoglobin: 14.1 g/dL (ref 11.1–15.9)

## 2018-04-06 LAB — T4, FREE: Free T4: 1.01 ng/dL (ref 0.82–1.77)

## 2018-04-06 NOTE — Patient Instructions (Addendum)
Medication Instructions:   If you need a refill on your cardiac medications before your next appointment, please call your pharmacy.   Lab work: Your physician recommends that you have lab work today- Hemoglobin and Hematocrit , TSH and T4.   If you have labs (blood work) drawn today and your tests are completely normal, you will receive your results only by: Marland Kitchen MyChart Message (if you have MyChart) OR . A paper copy in the mail If you have any lab test that is abnormal or we need to change your treatment, we will call you to review the results.  Testing/Procedures: None ordered today.  Follow-Up: At [facility], you and your health needs are our priority.  As part of our continuing mission to provide you with exceptional heart care, we have created designated Provider Care Teams.  These Care Teams include your primary Cardiologist (physician) and Advanced Practice Providers (APPs -  Physician Assistants and Nurse Practitioners) who all work together to provide you with the care you need, when you need it. You will need a follow up appointment in 12 months.  Please call our office 2 months in advance to schedule this appointment.  You may see Dr. Eden Emms or one of the following Advanced Practice Providers on your designated Care Team:   Norma Fredrickson, NP Nada Boozer, NP . Georgie Chard, NP

## 2018-05-02 DIAGNOSIS — Z13220 Encounter for screening for lipoid disorders: Secondary | ICD-10-CM | POA: Diagnosis not present

## 2018-05-02 DIAGNOSIS — Z0000 Encounter for general adult medical examination without abnormal findings: Secondary | ICD-10-CM | POA: Diagnosis not present

## 2018-05-02 DIAGNOSIS — I10 Essential (primary) hypertension: Secondary | ICD-10-CM | POA: Diagnosis not present

## 2018-05-03 ENCOUNTER — Other Ambulatory Visit: Payer: Self-pay | Admitting: *Deleted

## 2018-05-03 MED ORDER — METOPROLOL SUCCINATE ER 50 MG PO TB24: 50.0000 mg | ORAL_TABLET | Freq: Every day | ORAL | 3 refills | Status: DC

## 2018-10-24 ENCOUNTER — Other Ambulatory Visit: Payer: Self-pay | Admitting: Family Medicine

## 2018-10-24 DIAGNOSIS — Z1231 Encounter for screening mammogram for malignant neoplasm of breast: Secondary | ICD-10-CM

## 2018-11-28 ENCOUNTER — Ambulatory Visit
Admission: RE | Admit: 2018-11-28 | Discharge: 2018-11-28 | Disposition: A | Discharge status: Home / Self Care | Payer: 59 | Source: Ambulatory Visit | Attending: Family Medicine | Admitting: Family Medicine

## 2018-11-28 ENCOUNTER — Other Ambulatory Visit: Payer: Self-pay

## 2018-11-28 DIAGNOSIS — Z1231 Encounter for screening mammogram for malignant neoplasm of breast: Secondary | ICD-10-CM

## 2019-04-16 ENCOUNTER — Encounter (INDEPENDENT_AMBULATORY_CARE_PROVIDER_SITE_OTHER): Payer: Self-pay | Admitting: Family Medicine

## 2019-04-16 ENCOUNTER — Other Ambulatory Visit: Payer: Self-pay

## 2019-04-16 ENCOUNTER — Ambulatory Visit (INDEPENDENT_AMBULATORY_CARE_PROVIDER_SITE_OTHER): Payer: 59 | Admitting: Family Medicine

## 2019-04-16 VITALS — BP 171/96 | HR 96 | Temp 99.0°F | Ht 63.0 in | Wt 212.0 lb

## 2019-04-16 DIAGNOSIS — Z0289 Encounter for other administrative examinations: Secondary | ICD-10-CM

## 2019-04-16 DIAGNOSIS — Z6837 Body mass index (BMI) 37.0-37.9, adult: Secondary | ICD-10-CM

## 2019-04-16 DIAGNOSIS — Z9189 Other specified personal risk factors, not elsewhere classified: Secondary | ICD-10-CM

## 2019-04-16 DIAGNOSIS — R5383 Other fatigue: Secondary | ICD-10-CM | POA: Diagnosis not present

## 2019-04-16 DIAGNOSIS — E559 Vitamin D deficiency, unspecified: Secondary | ICD-10-CM | POA: Diagnosis not present

## 2019-04-16 DIAGNOSIS — Z1331 Encounter for screening for depression: Secondary | ICD-10-CM

## 2019-04-16 DIAGNOSIS — R0602 Shortness of breath: Secondary | ICD-10-CM

## 2019-04-16 DIAGNOSIS — I10 Essential (primary) hypertension: Secondary | ICD-10-CM

## 2019-04-16 NOTE — Progress Notes (Signed)
Chief Complaint:   OBESITY Andrea Gilbert (MR# [id number]) is a 44 y.o. female who presents for evaluation and treatment of obesity and related comorbidities. Current BMI is Body mass index is 37.55 kg/m.Marland Kitchen Andrea Gilbert has been struggling with her weight for many years and has been unsuccessful in either losing weight, maintaining weight loss, or reaching her healthy weight goal. Andrea Gilbert was told about our clinic by friends and family.  Andrea Gilbert is currently in the action stage of change and ready to dedicate time achieving and maintaining a healthier weight. Andrea Gilbert is interested in becoming our patient and working on intensive lifestyle modifications including (but not limited to) diet and exercise for weight loss.  Andrea Gilbert's habits were reviewed today and are as follows: Her family eats meals together, she thinks her family will eat healthier with her, her desired weight loss is 72 pounds, she started gaining weight after having children, her heaviest weight ever was 214 pounds, she has significant food cravings issues, she is frequently drinking liquids with calories and she struggles with emotional eating.  Andrea Gilbert has coffee with creamer, Natural Bliss sweet cream and Truvia, Lucky charms >1 cups with whole milk (feels satisfied). She may or may not have a snack. Lunch consists of fast food Quarter Pounder combo with sweet tea (eats all and feels full) or leftovers of steak or pork chop 4.5 ounces with a side (feels full), no afternoon snack. Dinner consists of pork chop or steak with 1 potato with sugar wafers after dinner.  Depression Screen Andrea Gilbert's Food and Mood (modified PHQ-9) score was mildly positive.  Depression screen PHQ 2/9 04/16/2019  Decreased Interest 1  Down, Depressed, Hopeless 0  PHQ - 2 Score 1  Altered sleeping 0  Tired, decreased energy 2  Change in appetite 1  Feeling bad or failure about yourself  1  Trouble concentrating 1  Moving slowly or  fidgety/restless 0  Suicidal thoughts 0  PHQ-9 Score 6  Difficult doing work/chores Not difficult at all   Subjective:   Other fatigue (new) Andrea Gilbert admits to daytime somnolence and admits to waking up still tired. Patent has a history of symptoms of daytime fatigue, morning fatigue, morning headache and hypertension. Andrea Gilbert generally gets 6 or 7 hours of sleep per night, and states that she has generally restful sleep. Snoring is not present. Apneic episodes are not present. Epworth Sleepiness Score is 10. EKG ordered today shows normal sinus rhythm at 99 BPM.  Shortness of breath on exertion (new) Andrea Gilbert notes increasing shortness of breath with exercising and seems to be worsening over time with weight gain. She notes getting out of breath sooner with activity than she used to. This has not gotten worse recently. Andrea Gilbert denies shortness of breath at rest or orthopnea.  Hypertension (white coat) Andrea Gilbert's blood pressure is significantly elevated. She has a history of white coat hypertension. Andrea Gilbert denies chest pain, chest pressure or headache.  BP Readings from Last 3 Encounters:  04/16/19 (!) 171/96  04/06/18 (!) 164/104  12/05/17 (!) 183/111   No results found for: CREATININE  Vitamin D deficiency  Andrea Gilbert's last Vitamin D level was 39 on 12/14/2017. She is not currently taking vit D. She admits to fatigue.  At risk for heart disease Andrea Gilbert is at a higher than average risk for cardiovascular disease due to obesity and hypertension. Reviewed: no chest pain on exertion, no dyspnea on exertion, and no swelling of ankles.  Assessment/Plan:   Other fatigue (new) Andrea Gilbert does feel that her  weight is causing her energy to be lower than it should be. Fatigue may be related to obesity, depression or many other causes. Labs and EKG will be ordered, and in the meanwhile, Andrea Gilbert will focus on self care including making healthy food choices, increasing physical  activity and focusing on stress reduction.  Shortness of breath on exertion (new) Andrea Gilbert does feel that she gets out of breath more easily that she used to when she exercises. Andrea Gilbert's shortness of breath appears to be obesity related and exercise induced. She has agreed to work on weight loss and gradually increase exercise to treat her exercise induced shortness of breath. Labs and indirect calorimetry will be ordered. Will continue to monitor closely.  Hypertension (white coat) Andrea Gilbert is working on healthy weight loss and exercise to improve blood pressure control. We will order CMP, EKG and labs today. Andrea Gilbert is to check her blood pressure 2 to 3 times per week and message. We will consider adjuvant therapy if her blood pressure is still elevated. We will watch for signs of hypotension as she continues her lifestyle modifications.  Vitamin D deficiency Low Vitamin D level contributes to fatigue and are associated with obesity, breast, and colon cancer. We will check vitamin D level today.  At risk for heart disease Andrea Gilbert was given approximately 15 minutes of coronary artery disease prevention counseling today. She is 44 y.o. female and has risk factors for heart disease including obesity and hypertension. We discussed intensive lifestyle modifications today with an emphasis on specific weight loss instructions and strategies.   Repetitive spaced learning was employed today to elicit superior memory formation and behavioral change.  Class 2 severe obesity with serious comorbidity and body mass index (BMI) of 37.0 to 37.9 in adult, unspecified obesity type (HCC) Andrea Gilbert is currently in the action stage of change and her goal is to continue with weight loss efforts. I recommend Andrea Gilbert begin the structured treatment plan as follows:  She has agreed to the Category 2 Plan.  Exercise goals: Andrea Gilbert can continue her current exercise regimen. No additional exercise is needed.     Behavioral modification strategies: increasing lean protein intake, increasing vegetables, meal planning and cooking strategies, keeping healthy foods in the home and planning for success.  She was informed of the importance of frequent follow-up visits to maximize her success with intensive lifestyle modifications for her multiple health conditions. She was informed we would discuss her lab results at her next visit unless there is a critical issue that needs to be addressed sooner. Andrea Gilbert agreed to keep her next visit at the agreed upon time to discuss these results.  Objective:   Blood pressure (!) 171/96, pulse 96, temperature 99 F (37.2 C), temperature source Oral, height 5\' 3"  (1.6 m), weight 212 lb (96.2 kg), last menstrual period 03/12/2019, SpO2 94 %. Body mass index is 37.55 kg/m.  EKG: Normal sinus rhythm, rate 99 BPM.  Indirect Calorimeter completed today shows a VO2 of 166 and a REE of 1153.  Her calculated basal metabolic rate is 03/14/2019 thus her basal metabolic rate is worse than expected.  General: Cooperative, alert, well developed, in no acute distress. HEENT: Conjunctivae and lids unremarkable. Cardiovascular: Regular rhythm.  Lungs: Normal work of breathing. Neurologic: No focal deficits.   No results found for: CREATININE, BUN, NA, K, CL, CO2 No results found for: ALT, AST, GGT, ALKPHOS, BILITOT No results found for: HGBA1C No results found for: INSULIN Lab Results  Component Value Date   TSH  2.480 04/06/2018   No results found for: CHOL, HDL, LDLCALC, LDLDIRECT, TRIG, CHOLHDL Lab Results  Component Value Date   WBC 8.9 03/04/2017   HGB 14.1 04/06/2018   HCT 40.6 04/06/2018   MCV 90.7 03/04/2017   PLT 225 03/04/2017   No results found for: IRON, TIBC, FERRITIN   Ref. Range 12/14/2017 09:31  Vitamin D, 25-Hydroxy Latest Ref Range: 30 - 100 ng/mL 39    Attestation Statements:   This is the patient's first visit at [facility]. The  patient's NEW PATIENT PACKET was reviewed at length. Included in the packet: current and past health history, medications, allergies, ROS, gynecologic history (women only), surgical history, family history, social history, weight history, weight loss surgery history (for those that have had weight loss surgery), nutritional evaluation, mood and food questionnaire, PHQ9, Epworth questionnaire, sleep habits questionnaire, patient life and health improvement goals questionnaire. These will all be scanned into the patient's chart under media.   During the visit, I independently reviewed the patient's EKG, bioimpedance scale results, and indirect calorimeter results. I used this information to tailor a meal plan for the patient that will help her to lose weight and will improve her obesity-related conditions going forward. I performed a medically necessary appropriate examination and/or evaluation. I discussed the assessment and treatment plan with the patient. The patient was provided an opportunity to ask questions and all were answered. The patient agreed with the plan and demonstrated an understanding of the instructions. Labs were ordered at this visit and will be reviewed at the next visit unless more critical results need to be addressed immediately. Clinical information was updated and documented in the EMR.     A separate 15 minutes was spent on risk counseling (see above).    I, Doreene Nest, am acting as transcriptionist for Eber Jones, MD.  I have reviewed the above documentation for accuracy and completeness, and I agree with the above. - Ilene Qua, MD

## 2019-04-17 LAB — COMPREHENSIVE METABOLIC PANEL
ALT: 14 IU/L (ref 0–32)
AST: 9 IU/L (ref 0–40)
Albumin/Globulin Ratio: 1.5 (ref 1.2–2.2)
Albumin: 4.1 g/dL (ref 3.8–4.8)
Alkaline Phosphatase: 80 IU/L (ref 39–117)
BUN/Creatinine Ratio: 15 (ref 9–23)
BUN: 10 mg/dL (ref 6–24)
Bilirubin Total: 0.3 mg/dL (ref 0.0–1.2)
CO2: 24 mmol/L (ref 20–29)
Calcium: 8.9 mg/dL (ref 8.7–10.2)
Chloride: 104 mmol/L (ref 96–106)
Creatinine, Ser: 0.66 mg/dL (ref 0.57–1.00)
GFR calc Af Amer: 124 mL/min/{1.73_m2} (ref >59)
GFR calc non Af Amer: 108 mL/min/{1.73_m2} (ref >59)
Globulin, Total: 2.8 g/dL (ref 1.5–4.5)
Glucose: 96 mg/dL (ref 65–99)
Potassium: 4.2 mmol/L (ref 3.5–5.2)
Sodium: 139 mmol/L (ref 134–144)
Total Protein: 6.9 g/dL (ref 6.0–8.5)

## 2019-04-17 LAB — CBC WITH DIFFERENTIAL/PLATELET
Basophils Absolute: 0 10*3/uL (ref 0.0–0.2)
Basos: 0 %
EOS (ABSOLUTE): 0.1 10*3/uL (ref 0.0–0.4)
Eos: 1 %
Hematocrit: 42.8 % (ref 34.0–46.6)
Hemoglobin: 14.9 g/dL (ref 11.1–15.9)
Immature Grans (Abs): 0.1 10*3/uL (ref 0.0–0.1)
Immature Granulocytes: 1 %
Lymphocytes Absolute: 1.5 10*3/uL (ref 0.7–3.1)
Lymphs: 15 %
MCH: 30.8 pg (ref 26.6–33.0)
MCHC: 34.8 g/dL (ref 31.5–35.7)
MCV: 88 fL (ref 79–97)
Monocytes Absolute: 0.6 10*3/uL (ref 0.1–0.9)
Monocytes: 6 %
Neutrophils Absolute: 7.8 10*3/uL — ABNORMAL HIGH (ref 1.4–7.0)
Neutrophils: 77 %
Platelets: 265 10*3/uL (ref 150–450)
RBC: 4.84 x10E6/uL (ref 3.77–5.28)
RDW: 12.9 % (ref 11.7–15.4)
WBC: 10.1 10*3/uL (ref 3.4–10.8)

## 2019-04-17 LAB — LIPID PANEL WITH LDL/HDL RATIO
Cholesterol, Total: 186 mg/dL (ref 100–199)
HDL: 51 mg/dL (ref >39)
LDL Chol Calc (NIH): 117 mg/dL — ABNORMAL HIGH (ref 0–99)
LDL/HDL Ratio: 2.3 ratio (ref 0.0–3.2)
Triglycerides: 102 mg/dL (ref 0–149)
VLDL Cholesterol Cal: 18 mg/dL (ref 5–40)

## 2019-04-17 LAB — TSH: TSH: 1.62 u[IU]/mL (ref 0.450–4.500)

## 2019-04-17 LAB — VITAMIN B12: Vitamin B-12: 553 pg/mL (ref 232–1245)

## 2019-04-17 LAB — VITAMIN D 25 HYDROXY (VIT D DEFICIENCY, FRACTURES): Vit D, 25-Hydroxy: 23.3 ng/mL — ABNORMAL LOW (ref 30.0–100.0)

## 2019-04-17 LAB — FOLATE: Folate: 2.3 ng/mL — ABNORMAL LOW (ref >3.0)

## 2019-04-17 LAB — T4, FREE: Free T4: 0.82 ng/dL (ref 0.82–1.77)

## 2019-04-17 LAB — HEMOGLOBIN A1C
Est. average glucose Bld gHb Est-mCnc: 108 mg/dL
Hgb A1c MFr Bld: 5.4 % (ref 4.8–5.6)

## 2019-04-17 LAB — INSULIN, RANDOM: INSULIN: 18.3 u[IU]/mL (ref 2.6–24.9)

## 2019-04-17 LAB — T3: T3, Total: 100 ng/dL (ref 71–180)

## 2019-04-30 ENCOUNTER — Ambulatory Visit (INDEPENDENT_AMBULATORY_CARE_PROVIDER_SITE_OTHER): Payer: 59 | Admitting: Family Medicine

## 2019-04-30 ENCOUNTER — Other Ambulatory Visit: Payer: Self-pay

## 2019-04-30 ENCOUNTER — Encounter (INDEPENDENT_AMBULATORY_CARE_PROVIDER_SITE_OTHER): Payer: Self-pay | Admitting: Family Medicine

## 2019-04-30 VITALS — BP 145/84 | HR 91 | Temp 98.4°F | Ht 63.0 in | Wt 204.0 lb

## 2019-04-30 DIAGNOSIS — E7849 Other hyperlipidemia: Secondary | ICD-10-CM

## 2019-04-30 DIAGNOSIS — Z9189 Other specified personal risk factors, not elsewhere classified: Secondary | ICD-10-CM

## 2019-04-30 DIAGNOSIS — E8881 Metabolic syndrome: Secondary | ICD-10-CM

## 2019-04-30 DIAGNOSIS — E559 Vitamin D deficiency, unspecified: Secondary | ICD-10-CM

## 2019-04-30 DIAGNOSIS — I10 Essential (primary) hypertension: Secondary | ICD-10-CM | POA: Diagnosis not present

## 2019-04-30 DIAGNOSIS — Z6836 Body mass index (BMI) 36.0-36.9, adult: Secondary | ICD-10-CM

## 2019-04-30 MED ORDER — VITAMIN D (ERGOCALCIFEROL) 1.25 MG (50000 UNIT) PO CAPS: 50000.0000 [IU] | ORAL_CAPSULE | ORAL | 0 refills | Status: DC

## 2019-04-30 MED ORDER — METOPROLOL SUCCINATE ER 50 MG PO TB24: 50.0000 mg | ORAL_TABLET | Freq: Every day | ORAL | 0 refills | Status: DC

## 2019-04-30 MED ORDER — LISINOPRIL 5 MG PO TABS: 5.0000 mg | ORAL_TABLET | Freq: Every day | ORAL | 0 refills | Status: DC

## 2019-04-30 NOTE — Progress Notes (Signed)
Chief Complaint:   OBESITY Andrea Gilbert is here to discuss her progress with her obesity treatment plan along with follow-up of her obesity related diagnoses. Andrea Gilbert is on the Category 2 Plan and states she is following her eating plan approximately 95% of the time. Andrea Gilbert states she is exercising 0 minutes 0 times per week.  Today's visit was #: 2 Starting weight: 212 lbs Starting date: 04/16/2019 Today's weight: 204 lbs Today's date: 04/30/2019 Total lbs lost to date: 8 Total lbs lost since last in-office visit: 8  Interim History: Andrea Gilbert has no hunger. She tried to use her snack calories with meals to make variety. She varied the amount of meat at night. Snacks were often celery with laughing cow cheese, cracker, hummus and beef jerky. She is going out of town for a few days in the next few weeks.  Subjective:   Essential hypertension  Andrea Gilbert has worsening hypertension. She denies chest pain, chest pressure or headache. Patient brought in her blood pressure readings, and two readings in the last two weeks were >140/>90 (145/100 and 142/90). She sees Dr. Eden Emms (cardiology). She is on metoprolol 50 mg daily. Today's blood pressure is 145/84.  BP Readings from Last 3 Encounters:  04/30/19 (!) 145/84  04/16/19 (!) 171/96  04/06/18 (!) 164/104   Lab Results  Component Value Date   CREATININE 0.66 04/16/2019   Vitamin D deficiency Andrea Gilbert's Vitamin D level was 23.3 on 04/16/19. She is not currently on vit D supplementation. She admits fatigue and denies nausea, vomiting or muscle weakness. Labs were discussed with patient today,  Other hyperlipidemia Andrea Gilbert has a diagnosis of hyperlipidemia, and she has LDL of 117, HDL of 51 and triglycerides of 102. She has ASCVD risk of 1.3%. Labs were discussed with patient today.  Lab Results  Component Value Date   CHOL 186 04/16/2019   HDL 51 04/16/2019   LDLCALC 117 (H) 04/16/2019   TRIG 102 04/16/2019   Lab  Results  Component Value Date   ALT 14 04/16/2019   AST 9 04/16/2019   ALKPHOS 80 04/16/2019   BILITOT 0.3 04/16/2019   The 10-year ASCVD risk score Denman George DC Jr., et al., 2013) is: 1.3%   Values used to calculate the score:     Age: 15 years     Sex: Female     Is Non-Hispanic African American: No     Diabetic: No     Tobacco smoker: No     Systolic Blood Pressure: 145 mmHg     Is BP treated: Yes     HDL Cholesterol: 51 mg/dL     Total Cholesterol: 186 mg/dL  Insulin resistance (new) Andrea Gilbert has a new diagnosis of insulin resistance based on her elevated fasting insulin level >5. Her last insulin level was at 18.3 (04/16/19). Her last A1c was at 5.4 (04/16/19). Andrea Gilbert has family history of diabetes. She continues to work on diet and exercise to decrease her risk of diabetes.  Lab Results  Component Value Date   INSULIN 18.3 04/16/2019   Lab Results  Component Value Date   HGBA1C 5.4 04/16/2019   At risk for heart disease Andrea Gilbert is at a higher than average risk for cardiovascular disease due to obesity, hypertension, hyperlipidemia and insulin resistance. Reviewed: no chest pain on exertion, no dyspnea on exertion, and no swelling of ankles.  Assessment/Plan:   Essential hypertension  Andrea Gilbert is working on healthy weight loss and exercise to improve blood pressure control. Andrea Gilbert  agrees to start Lisinopril 5 mg daily #30 with no refills and continue Metoprolol 50 mg daily #30 with no refills. We will watch for signs of hypotension as she continues her lifestyle modifications.  Vitamin D deficiency  Low Vitamin D level contributes to fatigue and are associated with obesity, breast, and colon cancer. Andrea Gilbert agrees to start prescription Vitamin D @50 ,000 IU every week #4 with no refills and she will follow-up for routine testing of Vitamin D, at least 2-3 times per year to avoid over-replacement.  Other hyperlipidemia Cardiovascular risk and specific lipid/LDL goals  reviewed.  We discussed several lifestyle modifications today and Andrea Gilbert will continue to work on diet, exercise and weight loss efforts. Orders and follow up as documented in patient record.   Counseling Intensive lifestyle modifications are the first line treatment for this issue. . Dietary changes: Increase soluble fiber. Decrease simple carbohydrates. . Exercise changes: Moderate to vigorous-intensity aerobic activity 150 minutes per week if tolerated. . Lipid-lowering medications: see documented in medical record.  Insulin resistance (new) Andrea Gilbert will continue to work on weight loss, exercise, and decreasing simple carbohydrates to help decrease the risk of diabetes. We will repeat labs in 3 months. Andrea Gilbert agreed to follow-up with Andrea Gilbert as directed to closely monitor her progress.  At risk for heart disease Andrea Gilbert was given approximately 15 minutes of coronary artery disease prevention counseling today. She is 44 y.o. female and has risk factors for heart disease including obesity, hypertension, hyperlipidemia and insulin resistance. We discussed intensive lifestyle modifications today with an emphasis on specific weight loss instructions and strategies.   Repetitive spaced learning was employed today to elicit superior memory formation and behavioral change.  Repetitive spaced learning was employed today to elicit superior memory formation and behavioral change.  Class 2 severe obesity with serious comorbidity and body mass index (BMI) of 36.0 to 36.9 in adult, unspecified obesity type (HCC) Andrea Gilbert is currently in the action stage of change. As such, her goal is to continue with weight loss efforts. She has agreed to the Category 2 Plan.   Exercise goals: Andrea Gilbert will continue exercise as is.  Behavioral modification strategies: increasing lean protein intake, increasing vegetables, meal planning and cooking strategies, keeping healthy foods in the home and planning for  success.  Andrea Gilbert has agreed to follow-up with our clinic in 2 weeks. She was informed of the importance of frequent follow-up visits to maximize her success with intensive lifestyle modifications for her multiple health conditions.   Objective:   Blood pressure (!) 145/84, pulse 91, temperature 98.4 F (36.9 C), temperature source Oral, height 5\' 3"  (1.6 m), weight 204 lb (92.5 kg), SpO2 94 %. Body mass index is 36.14 kg/m.  General: Cooperative, alert, well developed, in no acute distress. HEENT: Conjunctivae and lids unremarkable. Cardiovascular: Regular rhythm.  Lungs: Normal work of breathing. Neurologic: No focal deficits.   Lab Results  Component Value Date   CREATININE 0.66 04/16/2019   BUN 10 04/16/2019   NA 139 04/16/2019   K 4.2 04/16/2019   CL 104 04/16/2019   CO2 24 04/16/2019   Lab Results  Component Value Date   ALT 14 04/16/2019   AST 9 04/16/2019   ALKPHOS 80 04/16/2019   BILITOT 0.3 04/16/2019   Lab Results  Component Value Date   HGBA1C 5.4 04/16/2019   Lab Results  Component Value Date   INSULIN 18.3 04/16/2019   Lab Results  Component Value Date   TSH 1.620 04/16/2019   Lab Results  Component Value Date   CHOL 186 04/16/2019   HDL 51 04/16/2019   LDLCALC 117 (H) 04/16/2019   TRIG 102 04/16/2019   Lab Results  Component Value Date   WBC 10.1 04/16/2019   HGB 14.9 04/16/2019   HCT 42.8 04/16/2019   MCV 88 04/16/2019   PLT 265 04/16/2019   No results found for: IRON, TIBC, FERRITIN   Ref. Range 04/16/2019 11:54  Vitamin D, 25-Hydroxy Latest Ref Range: 30.0 - 100.0 ng/mL 23.3 (L)    Attestation Statements:   This is the patient's first visit at [facility]. The patient's NEW PATIENT PACKET was reviewed at length. Included in the packet: current and past health history, medications, allergies, ROS, gynecologic history (women only), surgical history, family history, social history, weight history, weight loss surgery  history (for those that have had weight loss surgery), nutritional evaluation, mood and food questionnaire, PHQ9, Epworth questionnaire, sleep habits questionnaire, patient life and health improvement goals questionnaire. These will all be scanned into the patient's chart under media.   During the visit, I independently reviewed the patient's EKG, bioimpedance scale results, and indirect calorimeter results. I used this information to tailor a meal plan for the patient that will help her to lose weight and will improve her obesity-related conditions going forward. I performed a medically necessary appropriate examination and/or evaluation. I discussed the assessment and treatment plan with the patient. The patient was provided an opportunity to ask questions and all were answered. The patient agreed with the plan and demonstrated an understanding of the instructions. Labs were ordered at this visit and will be reviewed at the next visit unless more critical results need to be addressed immediately. Clinical information was updated and documented in the EMR.   Time spent on visit including pre-visit chart review and post-visit care was 45 minutes.   A separate 15 minutes was spent on risk counseling (see above).    I, Doreene Nest, am acting as transcriptionist for Eber Jones, MD.  I have reviewed the above documentation for accuracy and completeness, and I agree with the above. - Ilene Qua, MD

## 2019-05-03 ENCOUNTER — Ambulatory Visit: Payer: 59 | Admitting: Cardiovascular Disease

## 2019-05-15 ENCOUNTER — Other Ambulatory Visit: Payer: Self-pay

## 2019-05-15 ENCOUNTER — Ambulatory Visit: Payer: 59 | Admitting: Cardiovascular Disease

## 2019-05-15 ENCOUNTER — Ambulatory Visit (INDEPENDENT_AMBULATORY_CARE_PROVIDER_SITE_OTHER): Payer: 59 | Admitting: Family Medicine

## 2019-05-15 ENCOUNTER — Encounter (INDEPENDENT_AMBULATORY_CARE_PROVIDER_SITE_OTHER): Payer: Self-pay | Admitting: Family Medicine

## 2019-05-15 VITALS — BP 146/96 | HR 76 | Temp 98.0°F | Ht 63.0 in | Wt 201.0 lb

## 2019-05-15 DIAGNOSIS — E8881 Metabolic syndrome: Secondary | ICD-10-CM

## 2019-05-15 DIAGNOSIS — I10 Essential (primary) hypertension: Secondary | ICD-10-CM | POA: Diagnosis not present

## 2019-05-15 DIAGNOSIS — Z6835 Body mass index (BMI) 35.0-35.9, adult: Secondary | ICD-10-CM

## 2019-05-15 NOTE — Progress Notes (Signed)
Chief Complaint:   OBESITY Andrea Gilbert is here to discuss her progress with her obesity treatment plan along with follow-up of her obesity related diagnoses. Andrea Gilbert is on the Category 2 Plan and states she is following her eating plan approximately 90-95% of the time. Andrea Gilbert states she is exercising 0 minutes 0 times per week.  Today's visit was #: 3 Starting weight: 212 lbs Starting date: 04/16/2019 Today's weight: 201 lbs Today's date: 05/15/2019 Total lbs lost to date: 11 Total lbs lost since last in-office visit: 3  Interim History: Andrea Gilbert went away for a period to [place]. She is starting to realize what she can and can't have at home. She is interested in possibly substituting breakfast for a [employer] shake. She ate beef jerky and Lays poppables for snacks.  Subjective:   Essential hypertension. Blood pressure is elevated again today. No chest pain, chest pressure, or headache. Blood pressure at home 130's/low 90's.  BP Readings from Last 3 Encounters:  05/15/19 (!) 146/96  04/30/19 (!) 145/84  04/16/19 (!) 171/96   Lab Results  Component Value Date   CREATININE 0.66 04/16/2019   Insulin resistance. Andrea Gilbert has a diagnosis of insulin resistance based on her elevated fasting insulin level >5. She continues to work on diet and exercise to decrease her risk of diabetes. Insulin and HgB A1c last checked 04/16/2019. She is on no medications.  Lab Results  Component Value Date   INSULIN 18.3 04/16/2019   Lab Results  Component Value Date   HGBA1C 5.4 04/16/2019   Assessment/Plan:   Essential hypertension. Andrea Gilbert is working on healthy weight loss and exercise to improve blood pressure control. We will watch for signs of hypotension as she continues her lifestyle modifications. She will continue lisinopril. Will have follow-up blood pressure at next appointment.  Insulin resistance. Andrea Gilbert will continue to work on weight loss, exercise, and  decreasing simple carbohydrates to help decrease the risk of diabetes. Andrea Gilbert agreed to follow-up with [place] as directed to closely monitor her progress. She will have repeat labs in 2 months.  Class 2 severe obesity with serious comorbidity and body mass index (BMI) of 35.0 to 35.9 in adult, unspecified obesity type (HCC).  Andrea Gilbert is currently in the action stage of change. As such, her goal is to continue with weight loss efforts. She has agreed to the Category 2 Plan.   Exercise goals: Andrea Gilbert will continue her current exercise regimen.  Behavioral modification strategies: increasing lean protein intake, increasing vegetables, meal planning and cooking strategies, keeping healthy foods in the home and planning for success.  Andrea Gilbert has agreed to follow-up with our clinic in 2 weeks. She was informed of the importance of frequent follow-up visits to maximize her success with intensive lifestyle modifications for her multiple health conditions.   Objective:   Blood pressure (!) 146/96, pulse 76, temperature 98 F (36.7 C), temperature source Oral, height 5\' 3"  (1.6 m), weight 201 lb (91.2 kg), last menstrual period 04/21/2019, SpO2 96 %. Body mass index is 35.61 kg/m.  General: Cooperative, alert, well developed, in no acute distress. HEENT: Conjunctivae and lids unremarkable. Cardiovascular: Regular rhythm.  Lungs: Normal work of breathing. Neurologic: No focal deficits.   Lab Results  Component Value Date   CREATININE 0.66 04/16/2019   BUN 10 04/16/2019   NA 139 04/16/2019   K 4.2 04/16/2019   CL 104 04/16/2019   CO2 24 04/16/2019   Lab Results  Component Value Date   ALT 14  04/16/2019   AST 9 04/16/2019   ALKPHOS 80 04/16/2019   BILITOT 0.3 04/16/2019   Lab Results  Component Value Date   HGBA1C 5.4 04/16/2019   Lab Results  Component Value Date   INSULIN 18.3 04/16/2019   Lab Results  Component Value Date   TSH 1.620 04/16/2019   Lab Results    Component Value Date   CHOL 186 04/16/2019   HDL 51 04/16/2019   LDLCALC 117 (H) 04/16/2019   TRIG 102 04/16/2019   Lab Results  Component Value Date   WBC 10.1 04/16/2019   HGB 14.9 04/16/2019   HCT 42.8 04/16/2019   MCV 88 04/16/2019   PLT 265 04/16/2019   No results found for: IRON, TIBC, FERRITIN  Attestation Statements:   Reviewed by clinician on day of visit: allergies, medications, problem list, medical history, surgical history, family history, social history, and previous encounter notes.  Time spent on visit including pre-visit chart review and post-visit charting and care was 15 minutes.   I, Andrea Gilbert, am acting as transcriptionist for Coralie Common, MD   I have reviewed the above documentation for accuracy and completeness, and I agree with the above. - Andrea Qua, MD

## 2019-05-24 NOTE — Progress Notes (Signed)
Cardiology Office Note   Date:  06/07/2019   ID:  Andrea, Gilbert October 27, 1975, MRN [id number]  PCP:  Blair Heys  Cardiologist:   Charlton Haws, MD   No chief complaint on file.     History of Present Illness: Andrea Gilbert is a 44 y.o. female who presents for f/u of atypical chest pain  I use to take care of her Dad [name] who had advanced CAD Calcium score done 04/30/16 was 0 Had some lung nodules that were benign And stable on f/u CT 05/26/17 Has had chronic right groin and back pain  Last seen by Dr Michaell Cowing suspected sports hernia She has had negative spine, GI and OB/Gyn evaluations for source of pain   04/06/18 In office BP / HR up thyroid studies normal BP up with primary as well Started on ACE Lisinopril 5 mg 04/30/19 and Toprol 50 mg   Insulin level up diagnosed with insulin resistance   [facility] and [facility] is down 11 [facility]   Father passed 3 years ago and niece had lymphoma    Past Medical History:  Diagnosis Date  . ADD (attention deficit disorder) 09/2009  . Anxiety   . Back pain   . Chest pain   . Constipation   . Gallbladder problem   . GERD (gastroesophageal reflux disease)   . Hypertension   . IBS (irritable bowel syndrome)   . Joint pain   . Migraine    last one 01/2017, otc med prn  . Multiple food allergies   . Palpitations   . PONV (postoperative nausea and vomiting)   . Stress reaction 2010  . Thoracic outlet syndrome     Past Surgical History:  Procedure Laterality Date  . BILATERAL SALPINGECTOMY Bilateral 03/11/2017   Procedure: BILATERAL SALPINGECTOMY;  Surgeon: Marlow Baars, MD;  Location: [facility];  Service: Gynecology;  Laterality: Bilateral;  . CESAREAN SECTION  1994, 2000   x 2  . CHOLECYSTECTOMY    . COLONOSCOPY    . ENDOMETRIAL ABLATION    . GANGLION CYST EXCISION Left    hand  . INGUINAL HERNIA REPAIR Bilateral 2019  . KNEE ARTHROSCOPY Left    X 2  . LAPAROSCOPY N/A 03/11/2017    Procedure: LAPAROSCOPY OPERATIVE, PERITONEAL BIOPSY, EXCISION LEFT OVARIAN MASS;  Surgeon: Marlow Baars, MD;  Location: [facility];  Service: Gynecology;  Laterality: N/A;  . REFRACTIVE SURGERY Left    Laser surgery Left eye  . TONSILLECTOMY    . TUBAL LIGATION    . UPPER GI ENDOSCOPY       Current Outpatient Medications  Medication Sig Dispense Refill  . cyclobenzaprine (FLEXERIL) 10 MG tablet Take 10 mg by mouth 3 (three) times daily as needed for muscle spasms.    Marland Kitchen dexlansoprazole (DEXILANT) 60 MG capsule Take 60 mg by mouth daily.    Marland Kitchen EPINEPHrine 0.3 mg/0.3 mL IJ SOAJ injection Inject 0.3 mg into the muscle once.    . lisdexamfetamine (VYVANSE) 30 MG capsule Take 30 mg by mouth daily as needed (for concentration).     Marland Kitchen lisinopril (ZESTRIL) 5 MG tablet Take 1 tablet (5 mg total) by mouth daily. 30 tablet 0  . metoprolol succinate (TOPROL-XL) 50 MG 24 hr tablet Take 1 tablet (50 mg total) by mouth daily. Take with or immediately following a meal. 30 tablet 0  . Vitamin D, Ergocalciferol, (DRISDOL) 1.25 MG (50000 UNIT) CAPS capsule Take 1 capsule (50,000 Units  total) by mouth every 7 (seven) days. 4 capsule 0   No current facility-administered medications for this visit.    Allergies:   Citric acid, Concerta [methylphenidate hcl er (cd)], Tomato, Adderall [amphetamine-dextroamphetamine], Strattera [atomoxetine hcl], and Sulfa antibiotics    Social History:  The patient  reports that she has never smoked. She has never used smokeless tobacco. She reports current alcohol use. She reports that she does not use drugs.   Family History:  The patient's family history includes Alcoholism in her father; CAD in her father; Cancer in her father; Diabetes in her father and paternal grandfather; Heart disease in her father; Hyperlipidemia in her father and mother; Hypertension in her father and mother.    ROS:  Please see the history of present illness.   Otherwise, review of systems are positive  for none.   All other systems are reviewed and negative.    PHYSICAL EXAM: VS:  BP 128/82   Pulse 76   Ht 5\' 3"  (1.6 m)   Wt 202 lb (91.6 kg)   SpO2 97%   BMI 35.78 kg/m  , BMI Body mass index is 35.78 kg/m. Affect appropriate Healthy:  appears stated age 35: normal Neck supple with no adenopathy JVP normal no bruits no thyromegaly Lungs clear with no wheezing and good diaphragmatic motion Heart:  S1/S2 no murmur, no rub, gallop or click PMI normal Abdomen: benighn, BS positve, no tenderness, no AAA no bruit.  No HSM or HJR Distal pulses intact with no bruits No edema Neuro non-focal Skin warm and dry No muscular weakness    EKG:  04/06/18 SR rate 116 voltage LVH in limb leads  CXR:   NAD   Recent Labs: 04/16/2019: ALT 14; BUN 10; Creatinine, Ser 0.66; Hemoglobin 14.9; Platelets 265; Potassium 4.2; Sodium 139; TSH 1.620    Lipid Panel    Component Value Date/Time   CHOL 186 04/16/2019 1154   TRIG 102 04/16/2019 1154   HDL 51 04/16/2019 1154   LDLCALC 117 (H) 04/16/2019 1154      Wt Readings from Last 3 Encounters:  06/07/19 202 lb (91.6 kg)  05/31/19 200 lb (90.7 kg)  05/15/19 201 lb (91.2 kg)      Other studies Reviewed: Additional studies/ records that were reviewed today include: Notes Dr Ihor Gully ECG and labs .    ASSESSMENT AND PLAN:  1. Chest Pain:  Non cardiac calcium score 0 observe ETT normal 04/30/16  2. GERD: continue dexilant  3. Right Groin Pain: ? Sports hernia f/u Dr Johney Maine has already done physical Rx 4. HTN:  Improved on Toprol and Lisinopril  5. DM: insulin resistance f/u Adair Patter working on weight loss and diet   Current medicines are reviewed at length with the patient today.  The patient does not have concerns regarding medicines.  The following changes have been made:  no change  Labs/ tests ordered today include: None   No orders of the defined types were placed in this encounter.    Disposition:   FU with me in a year       Signed, Jenkins Rouge, MD  06/07/2019 8:58 AM    [facility] [address], [place], [place]  [address] Phone: [phone]; Fax: [phone]

## 2019-05-30 ENCOUNTER — Other Ambulatory Visit (INDEPENDENT_AMBULATORY_CARE_PROVIDER_SITE_OTHER): Payer: Self-pay | Admitting: Family Medicine

## 2019-05-30 ENCOUNTER — Encounter (INDEPENDENT_AMBULATORY_CARE_PROVIDER_SITE_OTHER): Payer: Self-pay

## 2019-05-31 ENCOUNTER — Encounter (INDEPENDENT_AMBULATORY_CARE_PROVIDER_SITE_OTHER): Payer: Self-pay | Admitting: Family Medicine

## 2019-05-31 ENCOUNTER — Other Ambulatory Visit: Payer: Self-pay

## 2019-05-31 ENCOUNTER — Ambulatory Visit (INDEPENDENT_AMBULATORY_CARE_PROVIDER_SITE_OTHER): Payer: 59 | Admitting: Family Medicine

## 2019-05-31 VITALS — BP 142/88 | HR 93 | Temp 98.3°F | Ht 63.0 in | Wt 200.0 lb

## 2019-05-31 DIAGNOSIS — E559 Vitamin D deficiency, unspecified: Secondary | ICD-10-CM

## 2019-05-31 DIAGNOSIS — Z6835 Body mass index (BMI) 35.0-35.9, adult: Secondary | ICD-10-CM

## 2019-05-31 DIAGNOSIS — I10 Essential (primary) hypertension: Secondary | ICD-10-CM

## 2019-05-31 DIAGNOSIS — Z9189 Other specified personal risk factors, not elsewhere classified: Secondary | ICD-10-CM | POA: Diagnosis not present

## 2019-05-31 MED ORDER — LISINOPRIL 5 MG PO TABS: 5.0000 mg | ORAL_TABLET | Freq: Every day | ORAL | 0 refills | Status: DC

## 2019-06-01 NOTE — Progress Notes (Signed)
Chief Complaint:   OBESITY Andrea Gilbert is here to discuss her progress with her obesity treatment plan along with follow-up of her obesity related diagnoses. Andrea Gilbert is on the Category 2 Plan and states she is following her eating plan approximately 95% of the time. Andrea Gilbert states she is walking for 15-30 minutes 2-3 times per week.  Today's visit was #: 4 Starting weight: 212 lbs Starting date: 04/16/2019 Today's weight: 200 lbs Today's date: 05/31/2019 Total lbs lost to date: 12 Total lbs lost since last in-office visit: 1  Interim History: Andrea Gilbert had a good few weeks alternating between Category 2 and journaling, and staying within calories and getting protein in. She is getting around 6 oz of protein at dinner most of the time. Snacks are beef jerky and [employer] bars.  Subjective:   1. Essential hypertension Andrea Gilbert's blood pressure is slightly elevated today. Her blood pressure is controlled at work.  2. Vitamin D deficiency Andrea Gilbert's last Vit D was 23.3. She denies nausea, vomiting, or muscle weakness, but she notes fatigue.  3. At risk for heart disease Andrea Gilbert is at a higher than average risk for cardiovascular disease due to obesity. Reviewed: no chest pain on exertion, no dyspnea on exertion, and no swelling of ankles.  Assessment/Plan:   1. Essential hypertension Andrea Gilbert is working on healthy weight loss and exercise to improve blood pressure control. We will watch for signs of hypotension as she continues her lifestyle modifications. We will refill lisinopril for 1 month.  - lisinopril (ZESTRIL) 5 MG tablet; Take 1 tablet (5 mg total) by mouth daily.  Dispense: 30 tablet; Refill: 0  2. Vitamin D deficiency Low Vitamin D level contributes to fatigue and are associated with obesity, breast, and colon cancer. Andrea Gilbert agreed to continue taking prescription Vitamin D 50,000 IU every week and will follow-up for routine testing of Vitamin D, at least 2-3 times  per year to avoid over-replacement.  3. At risk for heart disease Andrea Gilbert was given approximately 15 minutes of coronary artery disease prevention counseling today. She is 44 y.o. female and has risk factors for heart disease including obesity. We discussed intensive lifestyle modifications today with an emphasis on specific weight loss instructions and strategies.   Repetitive spaced learning was employed today to elicit superior memory formation and behavioral change.  4. Class 2 severe obesity with serious comorbidity and body mass index (BMI) of 35.0 to 35.9 in adult, unspecified obesity type (HCC) Andrea Gilbert is currently in the action stage of change. As such, her goal is to continue with weight loss efforts. She has agreed to keeping a food journal and adhering to recommended goals of 1250 calories and 80+ grams of protein daily or following a lower carbohydrate, vegetable and lean protein rich diet plan.   Exercise goals: As is.  Behavioral modification strategies: increasing lean protein intake, increasing vegetables, meal planning and cooking strategies, keeping healthy foods in the home and planning for success.  Andrea Gilbert has agreed to follow-up with our clinic in 2 weeks. She was informed of the importance of frequent follow-up visits to maximize her success with intensive lifestyle modifications for her multiple health conditions.   Objective:   Blood pressure (!) 142/88, pulse 93, temperature 98.3 F (36.8 C), temperature source Oral, height 5\' 3"  (1.6 m), weight 200 lb (90.7 kg), SpO2 96 %. Body mass index is 35.43 kg/m.  General: Cooperative, alert, well developed, in no acute distress. HEENT: Conjunctivae and lids unremarkable. Cardiovascular: Regular rhythm.  Lungs:  Normal work of breathing. Neurologic: No focal deficits.   Lab Results  Component Value Date   CREATININE 0.66 04/16/2019   BUN 10 04/16/2019   NA 139 04/16/2019   K 4.2 04/16/2019   CL 104 04/16/2019    CO2 24 04/16/2019   Lab Results  Component Value Date   ALT 14 04/16/2019   AST 9 04/16/2019   ALKPHOS 80 04/16/2019   BILITOT 0.3 04/16/2019   Lab Results  Component Value Date   HGBA1C 5.4 04/16/2019   Lab Results  Component Value Date   INSULIN 18.3 04/16/2019   Lab Results  Component Value Date   TSH 1.620 04/16/2019   Lab Results  Component Value Date   CHOL 186 04/16/2019   HDL 51 04/16/2019   LDLCALC 117 (H) 04/16/2019   TRIG 102 04/16/2019   Lab Results  Component Value Date   WBC 10.1 04/16/2019   HGB 14.9 04/16/2019   HCT 42.8 04/16/2019   MCV 88 04/16/2019   PLT 265 04/16/2019   No results found for: IRON, TIBC, FERRITIN  Attestation Statements:   Reviewed by clinician on day of visit: allergies, medications, problem list, medical history, surgical history, family history, social history, and previous encounter notes.   I, Burt Knack, am acting as transcriptionist for Debbra Riding, MD.  I have reviewed the above documentation for accuracy and completeness, and I agree with the above. - Debbra Riding, MD

## 2019-06-07 ENCOUNTER — Other Ambulatory Visit: Payer: Self-pay

## 2019-06-07 ENCOUNTER — Encounter: Payer: Self-pay | Admitting: Cardiovascular Disease

## 2019-06-07 ENCOUNTER — Ambulatory Visit (INDEPENDENT_AMBULATORY_CARE_PROVIDER_SITE_OTHER): Payer: 59 | Admitting: Cardiovascular Disease

## 2019-06-07 VITALS — BP 128/82 | HR 76 | Ht 63.0 in | Wt 202.0 lb

## 2019-06-07 DIAGNOSIS — I10 Essential (primary) hypertension: Secondary | ICD-10-CM

## 2019-06-07 NOTE — Patient Instructions (Signed)
Medication Instructions:  *If you need a refill on your cardiac medications before your next appointment, please call your pharmacy*  Lab Work: If you have labs (blood work) drawn today and your tests are completely normal, you will receive your results only by: . MyChart Message (if you have MyChart) OR . A paper copy in the mail If you have any lab test that is abnormal or we need to change your treatment, we will call you to review the results.  Follow-Up: At [facility], you and your health needs are our priority.  As part of our continuing mission to provide you with exceptional heart care, we have created designated Provider Care Teams.  These Care Teams include your primary Cardiologist (physician) and Advanced Practice Providers (APPs -  Physician Assistants and Nurse Practitioners) who all work together to provide you with the care you need, when you need it.  We recommend signing up for the patient portal called "MyChart".  Sign up information is provided on this After Visit Summary.  MyChart is used to connect with patients for Virtual Visits (Telemedicine).  Patients are able to view lab/test results, encounter notes, upcoming appointments, etc.  Non-urgent messages can be sent to your provider as well.   To learn more about what you can do with MyChart, go to [email or web address].    Your next appointment:   12 month(s)  The format for your next appointment:   In Person  Provider:   You may see Dr. Nishan or one of the following Advanced Practice Providers on your designated Care Team:    Lori Gerhardt, NP  Laura Ingold, NP  Jill McDaniel, NP   

## 2019-06-13 ENCOUNTER — Other Ambulatory Visit (INDEPENDENT_AMBULATORY_CARE_PROVIDER_SITE_OTHER): Payer: Self-pay | Admitting: Family Medicine

## 2019-06-13 DIAGNOSIS — I10 Essential (primary) hypertension: Secondary | ICD-10-CM

## 2019-06-14 MED ORDER — METOPROLOL SUCCINATE ER 50 MG PO TB24: 50.0000 mg | ORAL_TABLET | Freq: Every day | ORAL | 0 refills | Status: DC

## 2019-06-19 ENCOUNTER — Other Ambulatory Visit: Payer: Self-pay

## 2019-06-19 ENCOUNTER — Encounter (INDEPENDENT_AMBULATORY_CARE_PROVIDER_SITE_OTHER): Payer: Self-pay | Admitting: Family Medicine

## 2019-06-19 ENCOUNTER — Ambulatory Visit (INDEPENDENT_AMBULATORY_CARE_PROVIDER_SITE_OTHER): Payer: 59 | Admitting: Family Medicine

## 2019-06-19 VITALS — BP 141/88 | HR 94 | Temp 98.3°F | Ht 63.0 in | Wt 195.6 lb

## 2019-06-19 DIAGNOSIS — E88819 Insulin resistance, unspecified: Secondary | ICD-10-CM | POA: Insufficient documentation

## 2019-06-19 DIAGNOSIS — E669 Obesity, unspecified: Secondary | ICD-10-CM | POA: Insufficient documentation

## 2019-06-19 DIAGNOSIS — Z9189 Other specified personal risk factors, not elsewhere classified: Secondary | ICD-10-CM

## 2019-06-19 DIAGNOSIS — E559 Vitamin D deficiency, unspecified: Secondary | ICD-10-CM | POA: Insufficient documentation

## 2019-06-19 DIAGNOSIS — E8881 Metabolic syndrome: Secondary | ICD-10-CM

## 2019-06-19 DIAGNOSIS — Z6834 Body mass index (BMI) 34.0-34.9, adult: Secondary | ICD-10-CM

## 2019-06-19 MED ORDER — VITAMIN D (ERGOCALCIFEROL) 1.25 MG (50000 UNIT) PO CAPS: 50000.0000 [IU] | ORAL_CAPSULE | ORAL | 0 refills | Status: DC

## 2019-06-19 NOTE — Progress Notes (Signed)
Chief Complaint:   OBESITY Andrea Gilbert is here to discuss her progress with her obesity treatment plan along with follow-up of her obesity related diagnoses. Andrea Gilbert is on keeping a food journal and adhering to recommended goals of 1250 calories and 80+ grams of protein daily and states she is following her eating plan approximately 95% of the time. Andrea Gilbert states she is walking for 30 minutes 3-4 times per week.  Today's visit was #: 5 Starting weight: 212 lbs Starting date: 04/16/2019 Today's weight: 195 lbs Today's date: 06/19/2019 Total lbs lost to date: 17 Total lbs lost since last in-office visit: 5  Interim History: Andrea Gilbert is doing intermittent fasting and eating between 12pm and 8pm. She feels this is working well for her. She denies polyphagia or emotional eating. She is meeting her protein goals but does not always meet calorie goals. She is journaling sporadically. Andrea Gilbert reports she has been meal prepping which she feels is quite important for her to stick to plan. She would like to add resistance training to her exercise regimen.   Subjective:   1. Insulin resistance Andrea Gilbert has a diagnosis of insulin resistance based on her elevated fasting insulin level >5. She continues to work on diet and exercise to decrease her risk of diabetes. She denies polyphagia. Lab Results  Component Value Date   HGBA1C 5.4 04/16/2019    2. Vitamin D deficiency Andrea Gilbert's Vit D level is not at goal. Last Vit D level was 23.3 on 04/16/2019. She is on prescription Vit D weekly.  3. At risk for deficient intake of food The patient is at a higher than average risk of deficient intake of food due to intermittent fasting.  Assessment/Plan:   1. Insulin resistance Andrea Gilbert will continue her meal plan, and will continue to work on weight loss, exercise, and decreasing simple carbohydrates to help decrease the risk of diabetes. Andrea Gilbert agreed to follow-up with [place] as directed to  closely monitor her progress.  2. Vitamin D deficiency Low Vitamin D level contributes to fatigue and are associated with obesity, breast, and colon cancer. We will refill prescription Vitamin D for 1 month. Andrea Gilbert will follow-up for routine testing of Vitamin D, at least 2-3 times per year to avoid over-replacement.  - Vitamin D, Ergocalciferol, (DRISDOL) 1.25 MG (50000 UNIT) CAPS capsule; Take 1 capsule (50,000 Units total) by mouth every 7 (seven) days.  Dispense: 4 capsule; Refill: 0  3. At risk for deficient intake of food Andrea Gilbert was given approximately 15 minutes of deficit intake of food prevention counseling today. Andrea Gilbert is at risk for eating too few calories because she is doing intermittent fasting and admits to not always reaching calorie goals.  She was encouraged to focus on meeting caloric and protein goals according to her recommended meal plan.   4. Class 1 obesity with serious comorbidity and body mass index (BMI) of 34.0 to 34.9 in adult, unspecified obesity type Andrea Gilbert is currently in the action stage of change. As such, her goal is to continue with weight loss efforts. She has agreed to keeping a food journal and adhering to recommended goals of 1150-1300 calories and 80 grams of protein daily.   Handout given today: Recipes. Andrea Gilbert is to journal to assure she is eating at least 1150 calories. I recemmended that she read "The Obesity Code" by Dr. Sharman Cheek.  Exercise goals: As is plus add resistance 2-3 times per week. She will monitor for excessive hunger with increased exercise and reduce exercise  if this occurs.   Behavioral modification strategies: planning for success and keeping a strict food journal.  Andrea Gilbert has agreed to follow-up with our clinic in 2 weeks with Dr. Kandis Nab. She was informed of the importance of frequent follow-up visits to maximize her success with intensive lifestyle modifications for her multiple health conditions.   Objective:     Blood pressure (!) 141/88, pulse 94, temperature 98.3 F (36.8 C), temperature source Oral, height 5\' 3"  (1.6 m), weight 195 lb 9.6 oz (88.7 kg), SpO2 99 %. Body mass index is 34.65 kg/m.  General: Cooperative, alert, well developed, in no acute distress. HEENT: Conjunctivae and lids unremarkable. Cardiovascular: Regular rhythm.  Lungs: Normal work of breathing. Neurologic: No focal deficits.   Lab Results  Component Value Date   CREATININE 0.66 04/16/2019   BUN 10 04/16/2019   NA 139 04/16/2019   K 4.2 04/16/2019   CL 104 04/16/2019   CO2 24 04/16/2019   Lab Results  Component Value Date   ALT 14 04/16/2019   AST 9 04/16/2019   ALKPHOS 80 04/16/2019   BILITOT 0.3 04/16/2019   Lab Results  Component Value Date   HGBA1C 5.4 04/16/2019   Lab Results  Component Value Date   INSULIN 18.3 04/16/2019   Lab Results  Component Value Date   TSH 1.620 04/16/2019   Lab Results  Component Value Date   CHOL 186 04/16/2019   HDL 51 04/16/2019   LDLCALC 117 (H) 04/16/2019   TRIG 102 04/16/2019   Lab Results  Component Value Date   WBC 10.1 04/16/2019   HGB 14.9 04/16/2019   HCT 42.8 04/16/2019   MCV 88 04/16/2019   PLT 265 04/16/2019   No results found for: IRON, TIBC, FERRITIN  Attestation Statements:   Reviewed by clinician on day of visit: allergies, medications, problem list, medical history, surgical history, family history, social history, and previous encounter notes.   06/14/2019, am acting as Trude Mcburney for Energy manager, FNP-C.  I have reviewed the above documentation for accuracy and completeness, and I agree with the above. -  [place], FNP

## 2019-07-03 ENCOUNTER — Encounter (INDEPENDENT_AMBULATORY_CARE_PROVIDER_SITE_OTHER): Payer: Self-pay | Admitting: Family Medicine

## 2019-07-03 ENCOUNTER — Ambulatory Visit (INDEPENDENT_AMBULATORY_CARE_PROVIDER_SITE_OTHER): Payer: 59 | Admitting: Family Medicine

## 2019-07-03 ENCOUNTER — Other Ambulatory Visit (INDEPENDENT_AMBULATORY_CARE_PROVIDER_SITE_OTHER): Payer: Self-pay | Admitting: Family Medicine

## 2019-07-03 ENCOUNTER — Other Ambulatory Visit: Payer: Self-pay

## 2019-07-03 VITALS — BP 145/90 | HR 84 | Temp 98.2°F | Ht 63.0 in | Wt 193.0 lb

## 2019-07-03 DIAGNOSIS — I10 Essential (primary) hypertension: Secondary | ICD-10-CM

## 2019-07-03 DIAGNOSIS — E669 Obesity, unspecified: Secondary | ICD-10-CM

## 2019-07-03 DIAGNOSIS — Z6834 Body mass index (BMI) 34.0-34.9, adult: Secondary | ICD-10-CM

## 2019-07-03 DIAGNOSIS — E559 Vitamin D deficiency, unspecified: Secondary | ICD-10-CM

## 2019-07-03 DIAGNOSIS — Z9189 Other specified personal risk factors, not elsewhere classified: Secondary | ICD-10-CM | POA: Diagnosis not present

## 2019-07-03 MED ORDER — METOPROLOL SUCCINATE ER 50 MG PO TB24: 50.0000 mg | ORAL_TABLET | Freq: Every day | ORAL | 0 refills | Status: DC

## 2019-07-04 NOTE — Progress Notes (Signed)
Chief Complaint:   OBESITY Andrea Gilbert is here to discuss her progress with her obesity treatment plan along with follow-up of her obesity related diagnoses. Andrea Gilbert is on keeping a food journal and adhering to recommended goals of 1150-1300 calories and 80 grams of protein daily and states she is following her eating plan approximately 90-95% of the time. 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  Today's visit was #: 6 Starting weight: 212 lbs Starting date: 04/16/2019 Today's weight: 193 lbs Today's date: 07/03/2019 Total lbs lost to date: 19 Total lbs lost since last in-office visit: 2  Interim History: Andrea Gilbert voices some days she journals and other days she doesn't but she is a habitual eater. She is still doing intermittent fasting, so she is often eating 2 times a day. She denies increase in hunger with exercise.  Subjective:   1. Essential hypertension Andrea Gilbert's blood pressure is slightly elevated today. She is seen by Dr. Eden Emms. She denies chest pain, chest pressure, or headaches.  2. Vitamin D deficiency Andrea Gilbert denies nausea, vomiting, or muscle weakness, but she notes fatigue. Last Vit D level was 23.3.  3. At risk for heart disease Andrea Gilbert is at a higher than average risk for cardiovascular disease due to obesity.   Assessment/Plan:   1. Essential hypertension Andrea Gilbert is working on healthy weight loss and exercise to improve blood pressure control. We will watch for signs of hypotension as she continues her lifestyle modifications. We will refill Toprol XL and lisinopril for 1 month.  - metoprolol succinate (TOPROL-XL) 50 MG 24 hr tablet; Take 1 tablet (50 mg total) by mouth daily. Take with or immediately following a meal.  Dispense: 30 tablet; Refill: 0 - lisinopril (ZESTRIL) 5 MG tablet; Take 1 tablet (5 mg total) by mouth daily.  Dispense: 30 tablet; Refill: 0  2. Vitamin D deficiency Low  Vitamin D level contributes to fatigue and are associated with obesity, breast, and colon cancer. Andrea Gilbert agreed to continue taking prescription Vitamin D 50,000 IU every week, no refill needed. She will follow-up for routine testing of Vitamin D, at least 2-3 times per year to avoid over-replacement.  3. At risk for heart disease Andrea Gilbert was given approximately 15 minutes of coronary artery disease prevention counseling today. She is 44 y.o. female and has risk factors for heart disease including obesity. We discussed intensive lifestyle modifications today with an emphasis on specific weight loss instructions and strategies.   Repetitive spaced learning was employed today to elicit superior memory formation and behavioral change.  4. Class 1 obesity with serious comorbidity and body mass index (BMI) of 34.0 to 34.9 in adult, unspecified obesity type Andrea Gilbert is currently in the action stage of change. As such, her goal is to continue with weight loss efforts. She has agreed to keeping a food journal and adhering to recommended goals of 1150-1300 calories and 80+ grams of protein daily.   Exercise goals: As is.  Behavioral modification strategies: increasing lean protein intake, increasing vegetables, meal planning and cooking strategies and keeping a strict food journal.  Andrea Gilbert has agreed to follow-up with our clinic in 2 to 3 weeks. She was informed of the importance of frequent follow-up visits to maximize her success with intensive lifestyle modifications for her multiple health conditions.   Objective:   Blood pressure (!) 145/90, pulse 84, temperature 98.2 F (36.8 C), temperature source Oral, height 5\' 3"  (1.6 m), weight 193 lb (87.5  kg), last menstrual period 06/28/2019, SpO2 98 %. Body mass index is 34.19 kg/m.  General: Cooperative, alert, well developed, in no acute distress. HEENT: Conjunctivae and lids unremarkable. Cardiovascular: Regular rhythm.  Lungs: Normal work  of breathing. Neurologic: No focal deficits.   Lab Results  Component Value Date   CREATININE 0.66 04/16/2019   BUN 10 04/16/2019   NA 139 04/16/2019   K 4.2 04/16/2019   CL 104 04/16/2019   CO2 24 04/16/2019   Lab Results  Component Value Date   ALT 14 04/16/2019   AST 9 04/16/2019   ALKPHOS 80 04/16/2019   BILITOT 0.3 04/16/2019   Lab Results  Component Value Date   HGBA1C 5.4 04/16/2019   Lab Results  Component Value Date   INSULIN 18.3 04/16/2019   Lab Results  Component Value Date   TSH 1.620 04/16/2019   Lab Results  Component Value Date   CHOL 186 04/16/2019   HDL 51 04/16/2019   LDLCALC 117 (H) 04/16/2019   TRIG 102 04/16/2019   Lab Results  Component Value Date   WBC 10.1 04/16/2019   HGB 14.9 04/16/2019   HCT 42.8 04/16/2019   MCV 88 04/16/2019   PLT 265 04/16/2019   No results found for: IRON, TIBC, FERRITIN  Attestation Statements:   Reviewed by clinician on day of visit: allergies, medications, problem list, medical history, surgical history, family history, social history, and previous encounter notes.   I, Trixie Dredge, am acting as transcriptionist for April Manson, MD. I have reviewed the above documentation for accuracy and completeness, and I agree with the above. - Ilene Qua, MD

## 2019-07-10 MED ORDER — LISINOPRIL 5 MG PO TABS: 5.0000 mg | ORAL_TABLET | Freq: Every day | ORAL | 0 refills | Status: DC

## 2019-07-17 ENCOUNTER — Ambulatory Visit (INDEPENDENT_AMBULATORY_CARE_PROVIDER_SITE_OTHER): Payer: 59 | Admitting: Family Medicine

## 2019-07-17 ENCOUNTER — Other Ambulatory Visit: Payer: Self-pay

## 2019-07-17 ENCOUNTER — Encounter (INDEPENDENT_AMBULATORY_CARE_PROVIDER_SITE_OTHER): Payer: Self-pay | Admitting: Family Medicine

## 2019-07-17 VITALS — BP 143/88 | HR 90 | Temp 97.3°F | Ht 63.0 in | Wt 188.0 lb

## 2019-07-17 DIAGNOSIS — Z9189 Other specified personal risk factors, not elsewhere classified: Secondary | ICD-10-CM | POA: Diagnosis not present

## 2019-07-17 DIAGNOSIS — I10 Essential (primary) hypertension: Secondary | ICD-10-CM | POA: Diagnosis not present

## 2019-07-17 DIAGNOSIS — E669 Obesity, unspecified: Secondary | ICD-10-CM | POA: Diagnosis not present

## 2019-07-17 DIAGNOSIS — E8881 Metabolic syndrome: Secondary | ICD-10-CM | POA: Diagnosis not present

## 2019-07-17 DIAGNOSIS — Z6833 Body mass index (BMI) 33.0-33.9, adult: Secondary | ICD-10-CM

## 2019-07-17 MED ORDER — LISINOPRIL 5 MG PO TABS: 5.0000 mg | ORAL_TABLET | Freq: Every day | ORAL | 0 refills | Status: DC

## 2019-07-17 MED ORDER — METOPROLOL SUCCINATE ER 50 MG PO TB24: 50.0000 mg | ORAL_TABLET | Freq: Every day | ORAL | 0 refills | Status: DC

## 2019-07-17 NOTE — Progress Notes (Signed)
Chief Complaint:   OBESITY Andrea Gilbert is here to discuss her progress with her obesity treatment plan along with follow-up of her obesity related diagnoses. Andrea Gilbert is on keeping a food journal and adhering to recommended goals of 1150-1300 calories and 80+ grams of protein and states she is following her eating plan approximately 100% of the time. Andrea Gilbert states she is doing cardio for 30-60 minutes 2 times per week and circuit training for 40 minutes 3 times per week.  Today's visit was #: 7 Starting weight: 212 lbs Starting date: 04/16/2019 Today's weight: 188 lbs Today's date: 07/17/2019 Total lbs lost to date: 24 lbs Total lbs lost since last in-office visit: 5 lbs  Interim History: Andrea Gilbert didn't journal, per se, the last few weeks, she mentally kept track.  She also logged in My[employer]itness[employer].  She has started exercising with no increase in hunger.  She denies anything planned for Mother's Day, but [place]  Subjective:   1. Essential hypertension Blood pressure is slightly elevated today.  No chest pain, chest pressure, or headache.  Occasional dizziness with change in position.   2. Insulin resistance Andrea Gilbert has a diagnosis of insulin resistance based on her elevated fasting insulin level >5. She continues to work on diet and exercise to decrease her risk of diabetes.  Denies cravings.  Lab Results  Component Value Date   INSULIN 18.3 04/16/2019   Lab Results  Component Value Date   HGBA1C 5.4 04/16/2019   3. At risk for heart disease Andrea Gilbert is at a higher than average risk for cardiovascular disease due to obesity.   Assessment/Plan:   1. Essential hypertension Andrea Gilbert is working on healthy weight loss and exercise to improve blood pressure control. We will watch for signs of hypotension as she continues her lifestyle modifications. - lisinopril (ZESTRIL) 5 MG tablet; Take 1 tablet (5 mg total) by mouth daily.   Dispense: 30 tablet; Refill: 0 - metoprolol succinate (TOPROL-XL) 50 MG 24 hr tablet; Take 1 tablet (50 mg total) by mouth daily. Take with or immediately following a meal.  Dispense: 30 tablet; Refill: 0  2. Insulin resistance Andrea Gilbert will continue to work on weight loss, exercise, and decreasing simple carbohydrates to help decrease the risk of diabetes. Andrea Gilbert agreed to follow-up with [place] as directed to closely monitor her progress.  3. At risk for heart disease Andrea Gilbert was given approximately 15 minutes of coronary artery disease prevention counseling today. She is 44 y.o. female and has risk factors for heart disease including obesity. We discussed intensive lifestyle modifications today with an emphasis on specific weight loss instructions and strategies.   Repetitive spaced learning was employed today to elicit superior memory formation and behavioral change.  4. Class 1 obesity with serious comorbidity and body mass index (BMI) of 33.0 to 33.9 in adult, unspecified obesity type Andrea Gilbert is currently in the action stage of change. As such, her goal is to continue with weight loss efforts. She has agreed to keeping a food journal and adhering to recommended goals of 1150-1300 calories and 80+ grams of protein.   Exercise goals: As is.  Behavioral modification strategies: increasing lean protein intake, increasing vegetables, meal planning and cooking strategies, keeping healthy foods in the home and planning for success.  Andrea Gilbert has agreed to follow-up with our clinic in 2 weeks. She was informed of the importance of frequent follow-up visits to maximize her success with intensive lifestyle modifications for  her multiple health conditions.   Objective:   Blood pressure (!) 143/88, pulse 90, temperature (!) 97.3 F (36.3 C), temperature source Oral, height 5\' 3"  (1.6 m), weight 188 lb (85.3 kg), last menstrual period 06/28/2019, SpO2 96 %. Body mass index is 33.3  kg/m.  General: Cooperative, alert, well developed, in no acute distress. HEENT: Conjunctivae and lids unremarkable. Cardiovascular: Regular rhythm.  Lungs: Normal work of breathing. Neurologic: No focal deficits.   Lab Results  Component Value Date   CREATININE 0.66 04/16/2019   BUN 10 04/16/2019   NA 139 04/16/2019   K 4.2 04/16/2019   CL 104 04/16/2019   CO2 24 04/16/2019   Lab Results  Component Value Date   ALT 14 04/16/2019   AST 9 04/16/2019   ALKPHOS 80 04/16/2019   BILITOT 0.3 04/16/2019   Lab Results  Component Value Date   HGBA1C 5.4 04/16/2019   Lab Results  Component Value Date   INSULIN 18.3 04/16/2019   Lab Results  Component Value Date   TSH 1.620 04/16/2019   Lab Results  Component Value Date   CHOL 186 04/16/2019   HDL 51 04/16/2019   LDLCALC 117 (H) 04/16/2019   TRIG 102 04/16/2019   Lab Results  Component Value Date   WBC 10.1 04/16/2019   HGB 14.9 04/16/2019   HCT 42.8 04/16/2019   MCV 88 04/16/2019   PLT 265 04/16/2019   Attestation Statements:   Reviewed by clinician on day of visit: allergies, medications, problem list, medical history, surgical history, family history, social history, and previous encounter notes.  I, 06/14/2019, CMA, am acting as transcriptionist for Insurance claims handler, MD.  I have reviewed the above documentation for accuracy and completeness, and I agree with the above. - Reuben Likes, MD

## 2019-07-27 IMAGING — MR MR LUMBAR SPINE W/O CM
4 of 5 series · 18 of 48 positions shown · non-contrast
Comparison: Lumbar spine radiographs 08/20/2016

CLINICAL DATA: Chronic right hip, groin, and low back pain.

EXAM:
MRI LUMBAR SPINE WITHOUT CONTRAST
TECHNIQUE: Multiplanar, multisequence MR imaging of the lumbar spine was
performed. No intravenous contrast was administered.

[Series 6: T2 · sagittal · 4.0mm · 0.73mm/px · 5 of 15 slices shown (1 of 2)]
[im 1/15]
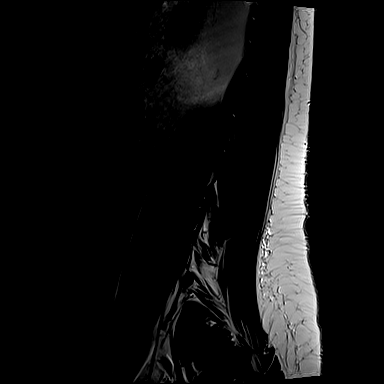
[im 4/15]
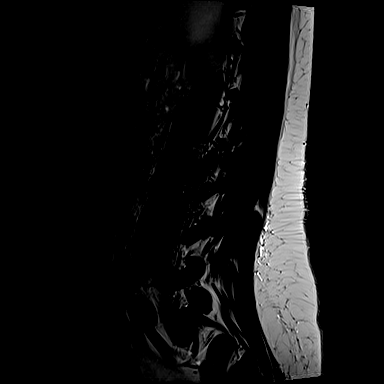
[im 8/15]
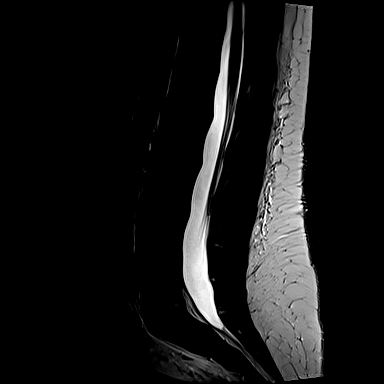
[im 11/15]
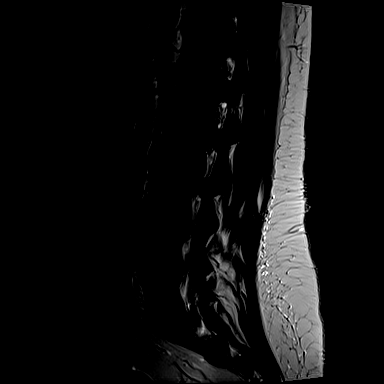
[im 15/15]
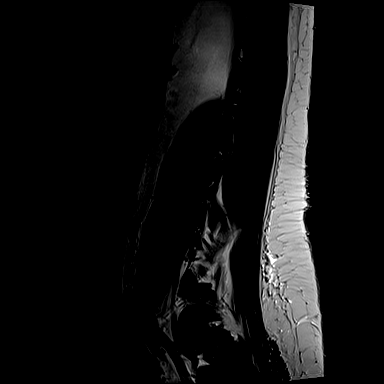

[Series 7: T1 · sagittal · 4.0mm · 0.73mm/px · 3 of 15 slices shown (1 of 2)]
[im 1/15]
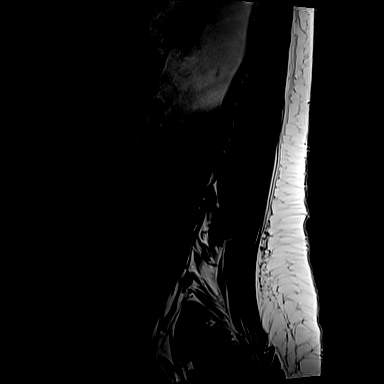
[im 8/15]
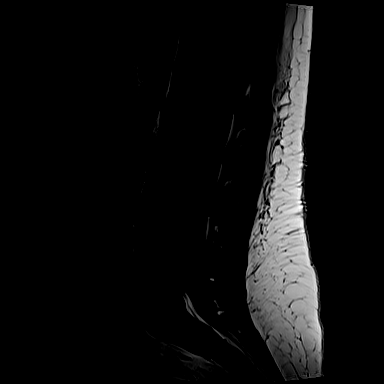
[im 15/15]
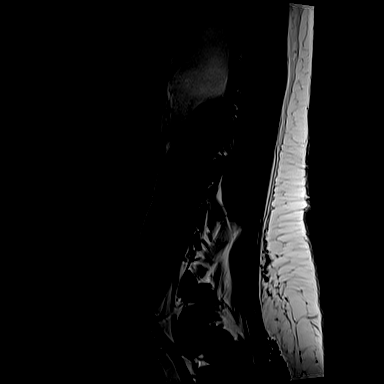

[Series 13: T2 · axial · 4.0mm · 0.28mm/px · z∈[-59,+117]mm · 7 of 42 slices shown (2 of 2)]
[im 3/42]
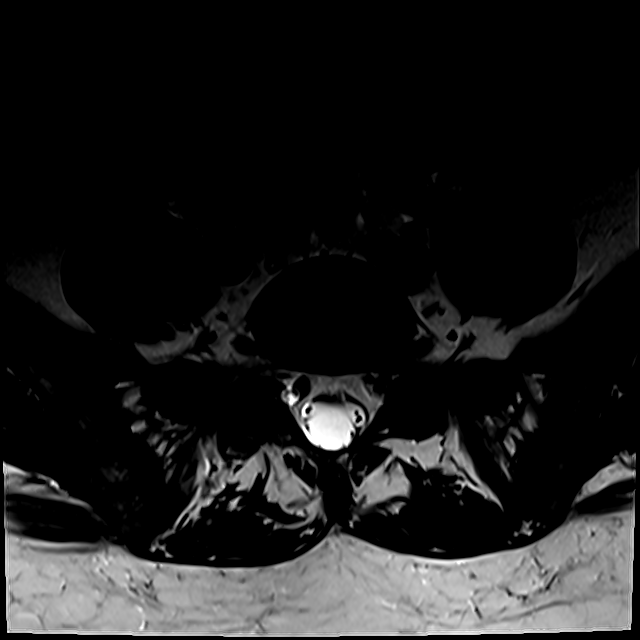
[im 6/42]
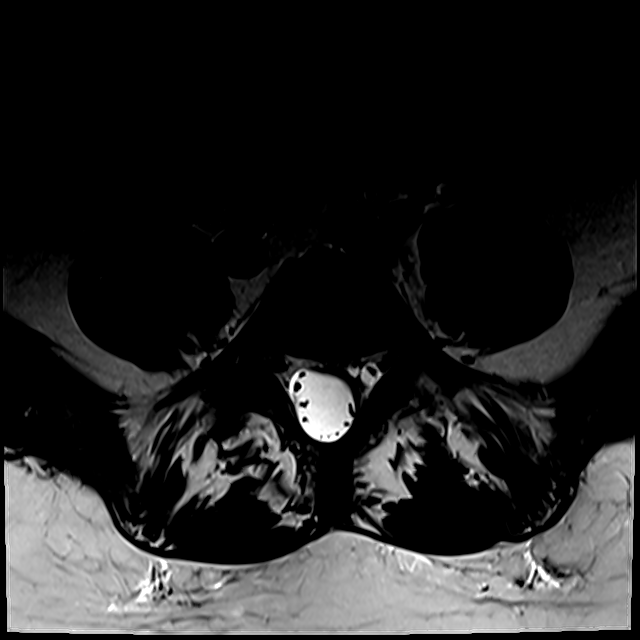
[im 9/42]
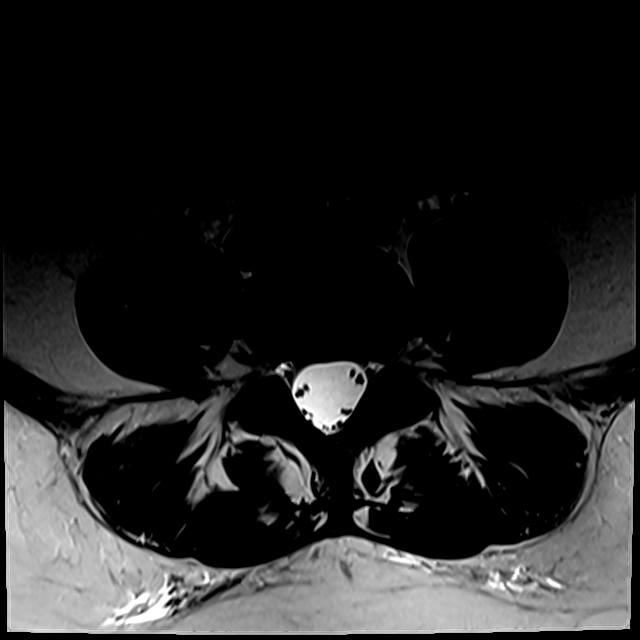
[im 14/42]
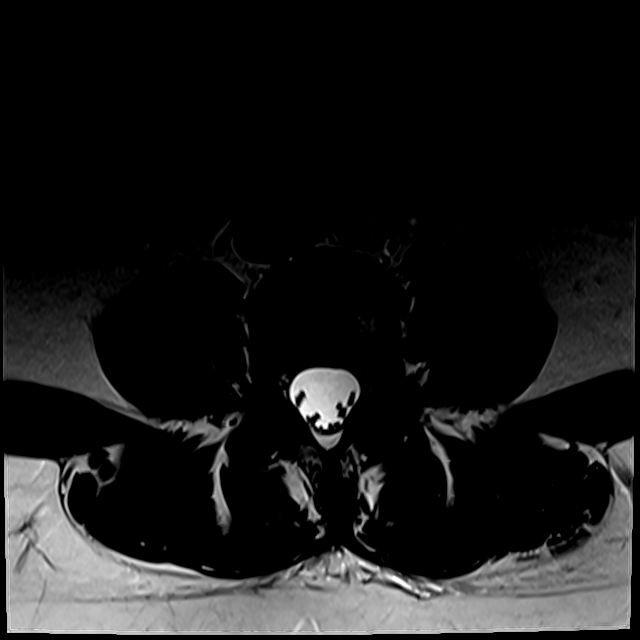
[im 20/42]
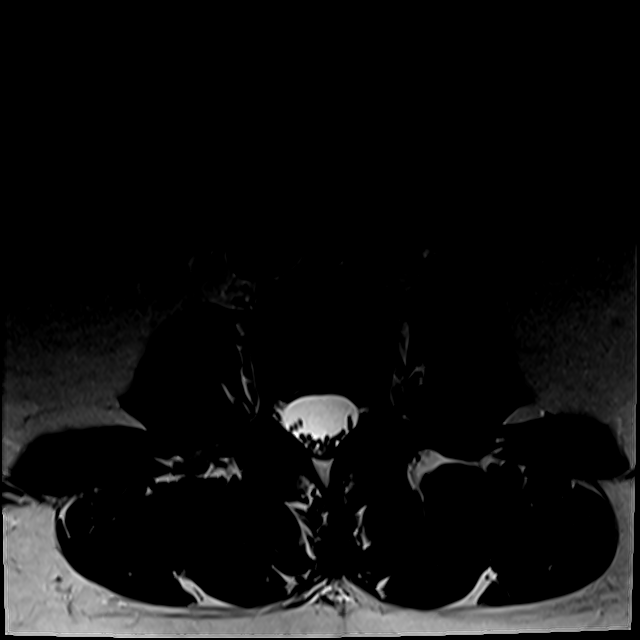
[im 22/42]
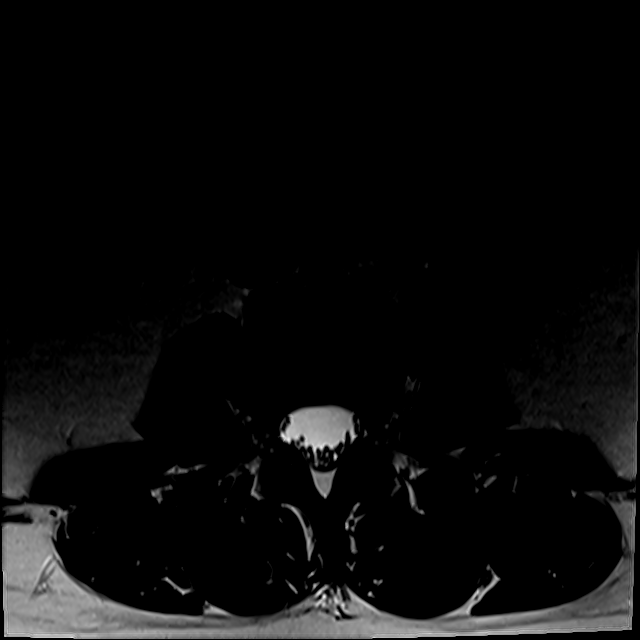
[im 36/42]
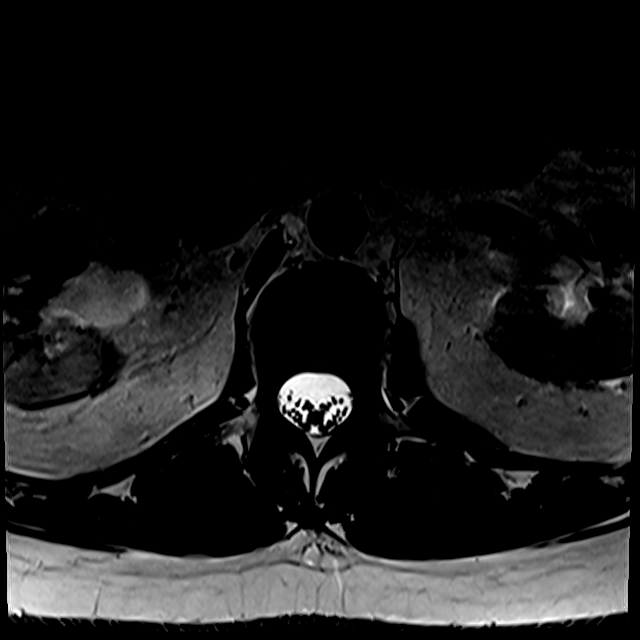

[Series 100: T1 · axial · 4.0mm · 0.28mm/px · z∈[-44,+117]mm · 3 of 42 slices shown (2 of 2)]
[im 6/42]
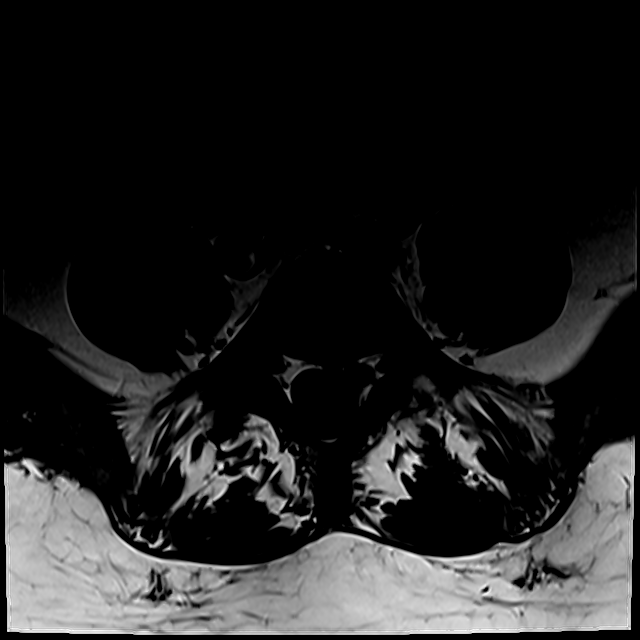
[im 22/42]
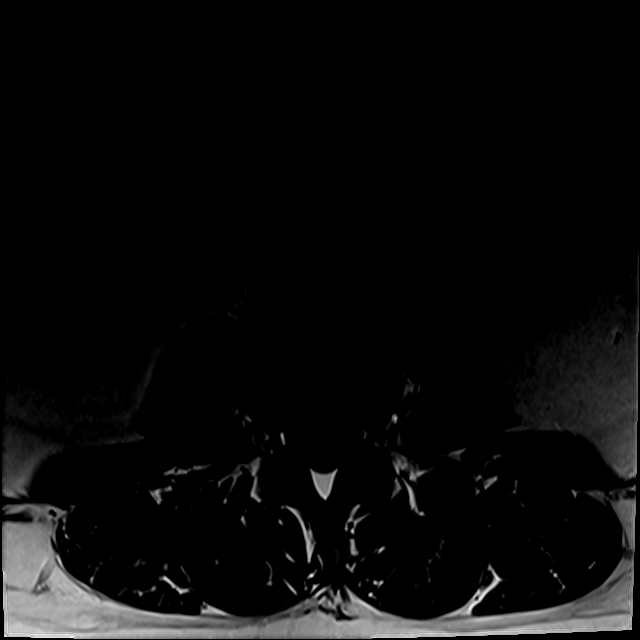
[im 36/42]
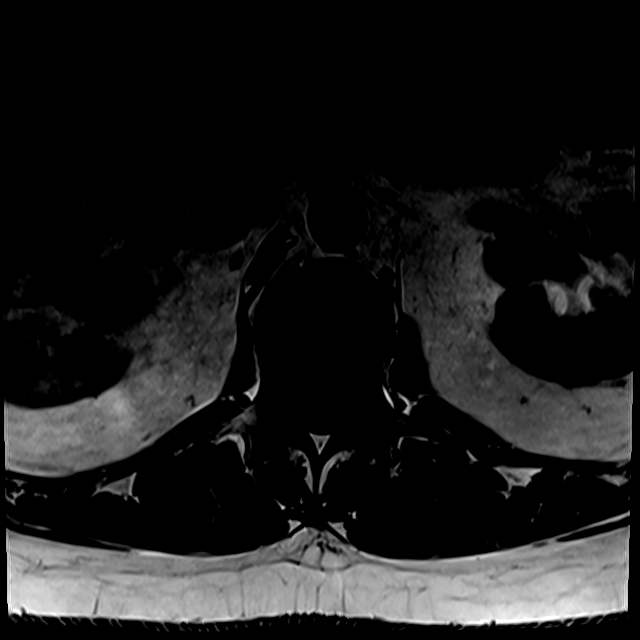

[18 of 48 positions shown; findings below may reference images not displayed]

FINDINGS: Segmentation:  Standard.

Alignment:  Normal.

Vertebrae: Diffusely diminished bone marrow T1 signal intensity,
nonspecific though can be seen with anemia, smoking, and obesity.
Small hemangiomas in the L4 vertebral body. No fracture or
significant marrow edema.

Conus medullaris and cauda equina: Conus extends to the L1 level.
Conus and cauda equina appear normal.

Paraspinal and other soft tissues: Unremarkable.

Disc levels:

Disc height and hydration are maintained throughout the lumbar
spine. No disc herniation is identified, and the spinal canal and
neural foramina are widely patent. There is moderate right and mild
left facet arthrosis at L5-S1.
IMPRESSION: 1. Moderate right facet arthrosis at L5-S1.
2. Normal discs.  No evidence of neural impingement.

## 2019-08-09 ENCOUNTER — Ambulatory Visit (INDEPENDENT_AMBULATORY_CARE_PROVIDER_SITE_OTHER): Payer: 59 | Admitting: Family Medicine

## 2019-08-09 ENCOUNTER — Encounter (INDEPENDENT_AMBULATORY_CARE_PROVIDER_SITE_OTHER): Payer: Self-pay | Admitting: Family Medicine

## 2019-08-09 ENCOUNTER — Other Ambulatory Visit: Payer: Self-pay

## 2019-08-09 VITALS — BP 145/86 | HR 83 | Temp 97.5°F | Ht 63.0 in | Wt 189.0 lb

## 2019-08-09 DIAGNOSIS — Z6833 Body mass index (BMI) 33.0-33.9, adult: Secondary | ICD-10-CM

## 2019-08-09 DIAGNOSIS — I10 Essential (primary) hypertension: Secondary | ICD-10-CM | POA: Diagnosis not present

## 2019-08-09 DIAGNOSIS — E8881 Metabolic syndrome: Secondary | ICD-10-CM | POA: Diagnosis not present

## 2019-08-09 DIAGNOSIS — Z9189 Other specified personal risk factors, not elsewhere classified: Secondary | ICD-10-CM

## 2019-08-09 DIAGNOSIS — E559 Vitamin D deficiency, unspecified: Secondary | ICD-10-CM

## 2019-08-09 DIAGNOSIS — E669 Obesity, unspecified: Secondary | ICD-10-CM

## 2019-08-09 MED ORDER — VITAMIN D (ERGOCALCIFEROL) 1.25 MG (50000 UNIT) PO CAPS: 50000.0000 [IU] | ORAL_CAPSULE | ORAL | 0 refills | Status: DC

## 2019-08-09 MED ORDER — LISINOPRIL 5 MG PO TABS: 5.0000 mg | ORAL_TABLET | Freq: Every day | ORAL | 0 refills | Status: DC

## 2019-08-09 MED ORDER — METOPROLOL SUCCINATE ER 50 MG PO TB24: 50.0000 mg | ORAL_TABLET | Freq: Every day | ORAL | 0 refills | Status: DC

## 2019-08-09 NOTE — Progress Notes (Signed)
Chief Complaint:   OBESITY Andrea Gilbert is here to discuss her progress with her obesity treatment plan along with follow-up of her obesity related diagnoses. Andrea Gilbert is on keeping a food journal and adhering to recommended goals of 1150-1300 calories and 80 grams of protein and states she is following her eating plan approximately 50% of the time. [name] states she is circuit training for 30-45 minutes 3 times per week and walking for 30 minutes 2-3 times per week.  Today's visit was #: 8 Starting weight: 212 lbs Starting date: 04/16/2019 Today's weight: 189 lbs Today's date: 08/09/2019 Total lbs lost to date: 23 lbs Total lbs lost since last in-office visit: 0   Interim History: Andrea Gilbert says that she has been more relaxed with journaling and may have indulged a few times.  [place].  Her goal is to lose weight by November.  Subjective:   1. Essential hypertension Review: taking medications as instructed, no medication side effects noted, no chest pain on exertion, no dyspnea on exertion, no swelling of ankles.  Blood pressure is slightly elevated today.  She is checking her blood pressure at home and says it is well-controlled.  BP Readings from Last 3 Encounters:  08/09/19 (!) 145/86  07/17/19 (!) 143/88  07/03/19 (!) 145/90   2. Vitamin D deficiency Bernadine's Vitamin D level was 23.3 on 04/16/2019. She is currently taking prescription vitamin D 50,000 IU each week. She denies nausea, vomiting or muscle weakness.  She endorses fatigue.  3. Insulin resistance Taylin has a diagnosis of insulin resistance based on her elevated fasting insulin level >5. She continues to work on diet and exercise to decrease her risk of diabetes.  She is not on any medication.  Lab Results  Component Value Date   INSULIN 18.3 04/16/2019   Lab Results  Component Value Date   HGBA1C 5.4 04/16/2019   4. At risk for diabetes  mellitus Andrea Gilbert is at higher than average risk for developing diabetes due to her obesity.   Assessment/Plan:   1. Essential hypertension [name] is working on healthy weight loss and exercise to improve blood pressure control. We will watch for signs of hypotension as she continues her lifestyle modifications. - lisinopril (ZESTRIL) 5 MG tablet; Take 1 tablet (5 mg total) by mouth daily.  Dispense: 30 tablet; Refill: 0 - metoprolol succinate (TOPROL-XL) 50 MG 24 hr tablet; Take 1 tablet (50 mg total) by mouth daily. Take with or immediately following a meal.  Dispense: 30 tablet; Refill: 0 - Comprehensive metabolic panel - Lipid Panel With LDL/HDL Ratio  2. Vitamin D deficiency Low Vitamin D level contributes to fatigue and are associated with obesity, breast, and colon cancer. She agrees to continue to take prescription Vitamin D @50 ,000 IU every week and will follow-up for routine testing of Vitamin D, at least 2-3 times per year to avoid over-replacement. - Vitamin D, Ergocalciferol, (DRISDOL) 1.25 MG (50000 UNIT) CAPS capsule; Take 1 capsule (50,000 Units total) by mouth every 7 (seven) days.  Dispense: 4 capsule; Refill: 0 - VITAMIN D 25 Hydroxy (Vit-D Deficiency, Fractures)  3. Insulin resistance Andrea Gilbert will continue to work on weight loss, exercise, and decreasing simple carbohydrates to help decrease the risk of diabetes. Andrea Gilbert agreed to follow-up with Trula Ore as directed to closely monitor her progress. - Hemoglobin A1c - Insulin, random  4. At risk for diabetes mellitus [name] was given approximately 15 minutes of diabetes education  and counseling today. We discussed intensive lifestyle modifications today with an emphasis on weight loss as well as increasing exercise and decreasing simple carbohydrates in her diet. We also reviewed medication options with an emphasis on risk versus benefit of those discussed.   Repetitive spaced learning was employed today to elicit  superior memory formation and behavioral change.  5. Class 1 obesity with serious comorbidity and body mass index (BMI) of 33.0 to 33.9 in adult, unspecified obesity type Andrea Gilbert is currently in the action stage of change. As such, her goal is to continue with weight loss efforts. She has agreed to keeping a food journal and adhering to recommended goals of 1150-1300 calories and 80+ grams of protein daily.   Exercise goals: As is.  Behavioral modification strategies: increasing lean protein intake, meal planning and cooking strategies, keeping healthy foods in the home, planning for success and keeping a strict food journal.  Andrea Gilbert has agreed to follow-up with our clinic in 3 weeks. She was informed of the importance of frequent follow-up visits to maximize her success with intensive lifestyle modifications for her multiple health conditions.   Andrea Gilbert was informed we would discuss her lab results at her next visit unless there is a critical issue that needs to be addressed sooner. Andrea Gilbert agreed to keep her next visit at the agreed upon time to discuss these results.  Objective:   Blood pressure (!) 145/86, pulse 83, temperature (!) 97.5 F (36.4 C), temperature source Oral, height 5\' 3"  (1.6 m), weight 189 lb (85.7 kg), last menstrual period 08/04/2019, SpO2 93 %. Body mass index is 33.48 kg/m.  General: Cooperative, alert, well developed, in no acute distress. HEENT: Conjunctivae and lids unremarkable. Cardiovascular: Regular rhythm.  Lungs: Normal work of breathing. Neurologic: No focal deficits.   Lab Results  Component Value Date   CREATININE 0.66 04/16/2019   BUN 10 04/16/2019   NA 139 04/16/2019   K 4.2 04/16/2019   CL 104 04/16/2019   CO2 24 04/16/2019   Lab Results  Component Value Date   ALT 14 04/16/2019   AST 9 04/16/2019   ALKPHOS 80 04/16/2019   BILITOT 0.3 04/16/2019   Lab Results  Component Value Date   HGBA1C 5.4 04/16/2019   Lab Results   Component Value Date   INSULIN 18.3 04/16/2019   Lab Results  Component Value Date   TSH 1.620 04/16/2019   Lab Results  Component Value Date   CHOL 186 04/16/2019   HDL 51 04/16/2019   LDLCALC 117 (H) 04/16/2019   TRIG 102 04/16/2019   Lab Results  Component Value Date   WBC 10.1 04/16/2019   HGB 14.9 04/16/2019   HCT 42.8 04/16/2019   MCV 88 04/16/2019   PLT 265 04/16/2019   Attestation Statements:   Reviewed by clinician on day of visit: allergies, medications, problem list, medical history, surgical history, family history, social history, and previous encounter notes.  I, Water quality scientist, CMA, am acting as transcriptionist for Coralie Common, MD.  I have reviewed the above documentation for accuracy and completeness, and I agree with the above. - Jinny Blossom, MD

## 2019-08-10 LAB — COMPREHENSIVE METABOLIC PANEL
ALT: 13 IU/L (ref 0–32)
AST: 12 IU/L (ref 0–40)
Albumin/Globulin Ratio: 1.5 (ref 1.2–2.2)
Albumin: 3.8 g/dL (ref 3.8–4.8)
Alkaline Phosphatase: 69 IU/L (ref 48–121)
BUN/Creatinine Ratio: 16 (ref 9–23)
BUN: 13 mg/dL (ref 6–24)
Bilirubin Total: <0.2 mg/dL (ref 0.0–1.2)
CO2: 22 mmol/L (ref 20–29)
Calcium: 8.7 mg/dL (ref 8.7–10.2)
Chloride: 108 mmol/L — ABNORMAL HIGH (ref 96–106)
Creatinine, Ser: 0.82 mg/dL (ref 0.57–1.00)
GFR calc Af Amer: 101 mL/min/{1.73_m2} (ref >59)
GFR calc non Af Amer: 87 mL/min/{1.73_m2} (ref >59)
Globulin, Total: 2.6 g/dL (ref 1.5–4.5)
Glucose: 95 mg/dL (ref 65–99)
Potassium: 4.3 mmol/L (ref 3.5–5.2)
Sodium: 142 mmol/L (ref 134–144)
Total Protein: 6.4 g/dL (ref 6.0–8.5)

## 2019-08-10 LAB — LIPID PANEL WITH LDL/HDL RATIO
Cholesterol, Total: 163 mg/dL (ref 100–199)
HDL: 40 mg/dL (ref >39)
LDL Chol Calc (NIH): 100 mg/dL — ABNORMAL HIGH (ref 0–99)
LDL/HDL Ratio: 2.5 ratio (ref 0.0–3.2)
Triglycerides: 127 mg/dL (ref 0–149)
VLDL Cholesterol Cal: 23 mg/dL (ref 5–40)

## 2019-08-10 LAB — VITAMIN D 25 HYDROXY (VIT D DEFICIENCY, FRACTURES): Vit D, 25-Hydroxy: 62.6 ng/mL (ref 30.0–100.0)

## 2019-08-10 LAB — HEMOGLOBIN A1C
Est. average glucose Bld gHb Est-mCnc: 103 mg/dL
Hgb A1c MFr Bld: 5.2 % (ref 4.8–5.6)

## 2019-08-10 LAB — INSULIN, RANDOM: INSULIN: 13 u[IU]/mL (ref 2.6–24.9)

## 2019-09-04 ENCOUNTER — Ambulatory Visit (INDEPENDENT_AMBULATORY_CARE_PROVIDER_SITE_OTHER): Payer: 59 | Admitting: Family Medicine

## 2019-09-04 ENCOUNTER — Encounter (INDEPENDENT_AMBULATORY_CARE_PROVIDER_SITE_OTHER): Payer: Self-pay | Admitting: Family Medicine

## 2019-09-04 ENCOUNTER — Other Ambulatory Visit: Payer: Self-pay

## 2019-09-04 VITALS — BP 152/94 | HR 85 | Temp 98.1°F | Ht 63.0 in | Wt 189.0 lb

## 2019-09-04 DIAGNOSIS — E559 Vitamin D deficiency, unspecified: Secondary | ICD-10-CM

## 2019-09-04 DIAGNOSIS — Z9189 Other specified personal risk factors, not elsewhere classified: Secondary | ICD-10-CM

## 2019-09-04 DIAGNOSIS — I10 Essential (primary) hypertension: Secondary | ICD-10-CM | POA: Diagnosis not present

## 2019-09-04 DIAGNOSIS — E669 Obesity, unspecified: Secondary | ICD-10-CM

## 2019-09-04 DIAGNOSIS — Z6833 Body mass index (BMI) 33.0-33.9, adult: Secondary | ICD-10-CM

## 2019-09-04 MED ORDER — VITAMIN D (ERGOCALCIFEROL) 1.25 MG (50000 UNIT) PO CAPS: 50000.0000 [IU] | ORAL_CAPSULE | ORAL | 0 refills | Status: DC

## 2019-09-04 MED ORDER — LISINOPRIL 5 MG PO TABS: 5.0000 mg | ORAL_TABLET | Freq: Every day | ORAL | 0 refills | Status: DC

## 2019-09-04 MED ORDER — METOPROLOL SUCCINATE ER 50 MG PO TB24: 50.0000 mg | ORAL_TABLET | Freq: Every day | ORAL | 0 refills | Status: DC

## 2019-09-05 NOTE — Progress Notes (Signed)
Chief Complaint:   OBESITY Andrea Gilbert is here to discuss her progress with her obesity treatment plan along with follow-up of her obesity related diagnoses. Andrea Gilbert is on keeping a food journal and adhering to recommended goals of 1150-1300 calories and 80+ grams of protein and states she is following her eating plan approximately 75% of the time. Andrea Gilbert states she is doing trampoline for 10-20 minutes 3 times per week and circuit training for 30 minutes 7 times per week.  Today's visit was #: 9 Starting weight: 212 lbs Starting date: 04/16/2019 Today's weight: 189 lbs Today's date: 09/04/2019 Total lbs lost to date: 23 lbs Total lbs lost since last in-office visit: 0  Interim History: Andrea Gilbert [place].  She is planning on taking some intermittent time off in the next few weeks and [place]  She has been relaxed with her journaling and has not been as fastidious on logging and making sure she is at her calorie goal.  Subjective:   1. Essential hypertension Review: taking medications as instructed, no medication side effects noted, no chest pain on exertion, no dyspnea on exertion, no swelling of ankles.  Blood pressure is slightly elevated today.  BP Readings from Last 3 Encounters:  09/04/19 (!) 152/94  08/09/19 (!) 145/86  07/17/19 (!) 143/88   2. Vitamin D deficiency Andrea Gilbert's Vitamin D level was 62.6 on 08/09/2019. She is currently taking prescription vitamin D 50,000 IU each week. She denies nausea, vomiting or muscle weakness.  She endorses fatigue.  3. At risk for heart disease Andrea Gilbert is at a higher than average risk for cardiovascular disease due to obesity.   Assessment/Plan:   1. Essential hypertension Andrea Gilbert is working on healthy weight loss and exercise to improve blood pressure control. We will watch for signs of hypotension as she continues her lifestyle modifications.  Will refill  medications, as per below.  Andrea Gilbert will keep a log and send it via MyChart. - lisinopril (ZESTRIL) 5 MG tablet; Take 1 tablet (5 mg total) by mouth daily.  Dispense: 30 tablet; Refill: 0 - metoprolol succinate (TOPROL-XL) 50 MG 24 hr tablet; Take 1 tablet (50 mg total) by mouth daily. Take with or immediately following a meal.  Dispense: 90 tablet; Refill: 0  2. Vitamin D deficiency Low Vitamin D level contributes to fatigue and are associated with obesity, breast, and colon cancer. She agrees to continue to take prescription Vitamin D @50 ,000 IU every 2 weeks and will follow-up for routine testing of Vitamin D, at least 2-3 times per year to avoid over-replacement. - Vitamin D, Ergocalciferol, (DRISDOL) 1.25 MG (50000 UNIT) CAPS capsule; Take 1 capsule (50,000 Units total) by mouth every 14 (fourteen) days.  Dispense: 4 capsule; Refill: 0  3. At risk for heart disease Andrea Gilbert was given approximately 15 minutes of coronary artery disease prevention counseling today. She is 44 y.o. female and has risk factors for heart disease including obesity. We discussed intensive lifestyle modifications today with an emphasis on specific weight loss instructions and strategies.   Repetitive spaced learning was employed today to elicit superior memory formation and behavioral change.  4. Class 1 obesity with serious comorbidity and body mass index (BMI) of 33.0 to 33.9 in adult, unspecified obesity type Andrea Gilbert is currently in the action stage of change. As such, her goal is to continue with weight loss efforts. She has agreed to keeping a food journal and adhering  to recommended goals of 1150-1300 calories and 75+ grams of protein daily.  Exercise goals: She will start doing trampoline exercise for 10 minutes 3 times per week.  Behavioral modification strategies: increasing lean protein intake, meal planning and cooking strategies, keeping healthy foods in the home and keeping a strict food  journal.  Andrea Gilbert has agreed to follow-up with our clinic in 3 weeks. She was informed of the importance of frequent follow-up visits to maximize her success with intensive lifestyle modifications for her multiple health conditions.   Objective:   Blood pressure (!) 152/94, pulse 85, temperature 98.1 F (36.7 C), temperature source Oral, height 5\' 3"  (1.6 m), weight 189 lb (85.7 kg), last menstrual period 08/28/2019, SpO2 98 %. Body mass index is 33.48 kg/m.  General: Cooperative, alert, well developed, in no acute distress. HEENT: Conjunctivae and lids unremarkable. Cardiovascular: Regular rhythm.  Lungs: Normal work of breathing. Neurologic: No focal deficits.   Lab Results  Component Value Date   CREATININE 0.82 08/09/2019   BUN 13 08/09/2019   NA 142 08/09/2019   K 4.3 08/09/2019   CL 108 (H) 08/09/2019   CO2 22 08/09/2019   Lab Results  Component Value Date   ALT 13 08/09/2019   AST 12 08/09/2019   ALKPHOS 69 08/09/2019   BILITOT <0.2 08/09/2019   Lab Results  Component Value Date   HGBA1C 5.2 08/09/2019   HGBA1C 5.4 04/16/2019   Lab Results  Component Value Date   INSULIN 13.0 08/09/2019   INSULIN 18.3 04/16/2019   Lab Results  Component Value Date   TSH 1.620 04/16/2019   Lab Results  Component Value Date   CHOL 163 08/09/2019   HDL 40 08/09/2019   LDLCALC 100 (H) 08/09/2019   TRIG 127 08/09/2019   Lab Results  Component Value Date   WBC 10.1 04/16/2019   HGB 14.9 04/16/2019   HCT 42.8 04/16/2019   MCV 88 04/16/2019   PLT 265 04/16/2019   Attestation Statements:   Reviewed by clinician on day of visit: allergies, medications, problem list, medical history, surgical history, family history, social history, and previous encounter notes.  I, 06/14/2019, CMA, am acting as transcriptionist for Insurance claims handler, MD.  I have reviewed the above documentation for accuracy and completeness, and I agree with the above. - Reuben Likes,  MD

## 2019-10-02 ENCOUNTER — Other Ambulatory Visit: Payer: Self-pay

## 2019-10-02 ENCOUNTER — Encounter (INDEPENDENT_AMBULATORY_CARE_PROVIDER_SITE_OTHER): Payer: Self-pay | Admitting: Family Medicine

## 2019-10-02 ENCOUNTER — Ambulatory Visit (INDEPENDENT_AMBULATORY_CARE_PROVIDER_SITE_OTHER): Payer: 59 | Admitting: Family Medicine

## 2019-10-02 VITALS — BP 145/87 | HR 65 | Temp 98.2°F | Ht 63.0 in | Wt 187.0 lb

## 2019-10-02 DIAGNOSIS — I10 Essential (primary) hypertension: Secondary | ICD-10-CM | POA: Diagnosis not present

## 2019-10-02 DIAGNOSIS — Z9189 Other specified personal risk factors, not elsewhere classified: Secondary | ICD-10-CM

## 2019-10-02 DIAGNOSIS — E669 Obesity, unspecified: Secondary | ICD-10-CM

## 2019-10-02 DIAGNOSIS — E7849 Other hyperlipidemia: Secondary | ICD-10-CM | POA: Diagnosis not present

## 2019-10-02 DIAGNOSIS — Z6833 Body mass index (BMI) 33.0-33.9, adult: Secondary | ICD-10-CM

## 2019-10-04 MED ORDER — METOPROLOL SUCCINATE ER 50 MG PO TB24: 50.0000 mg | ORAL_TABLET | Freq: Every day | ORAL | 0 refills | Status: DC

## 2019-10-04 MED ORDER — LISINOPRIL 5 MG PO TABS: 5.0000 mg | ORAL_TABLET | Freq: Every day | ORAL | 0 refills | Status: DC

## 2019-10-04 NOTE — Progress Notes (Signed)
Chief Complaint:   OBESITY Andrea Gilbert is here to discuss her progress with her obesity treatment plan along with follow-up of her obesity related diagnoses. Andrea Gilbert is on keeping a food journal and adhering to recommended goals of 1150-1300 calories and 75+ grams of protein daily and states she is following her eating plan approximately 90% of the time. Andrea Gilbert states she is doing Education administrator for 30 minutes 3 times per week, on the trampoline for 10 minutes 3-4 times per week, and walking for 20 minutes 3 times per week.  Today's visit was #: 10 Starting weight: 212 lbs Starting date: 04/16/2019 Today's weight: 187 lbs Today's date: 10/02/2019 Total lbs lost to date: 25 Total lbs lost since last in-office visit: 2  Interim History: Andrea Gilbert didn't track on [employer]itness [employer] but she got into a habit of eating more basic to try to get back on track. She is more habitual in her eating. She is going away next week but she knows what can keep her within her limits.  Subjective:   1. Essential hypertension Matricia's blood pressure is well controlled at home. She bought her blood pressure readings to her appointment, and systolic was 112-133/71-99.  2. Other hyperlipidemia Aveline's last LDL was 100 at the end of May. She is not on medications.  3. At risk for heart disease Andrea Gilbert is at a higher than average risk for cardiovascular disease due to obesity.   Assessment/Plan:   1. Essential hypertension Lanise is working on healthy weight loss and exercise to improve blood pressure control. We will watch for signs of hypotension as she continues her lifestyle modifications. We will refill lisinopril and metoprolol for 90 days with no refills.  - metoprolol succinate (TOPROL-XL) 50 MG 24 hr tablet; Take 1 tablet (50 mg total) by mouth daily. Take with or immediately following a meal.  Dispense: 90 tablet; Refill: 0 - lisinopril (ZESTRIL) 5 MG tablet; Take 1 tablet (5 mg  total) by mouth daily.  Dispense: 90 tablet; Refill: 0  2. Other hyperlipidemia Cardiovascular risk and specific lipid/LDL goals reviewed. We discussed several lifestyle modifications today. Andrea Gilbert will continue to work on diet, exercise and weight loss efforts. We will repeat labs in 2 months. Orders and follow up as documented in patient record.   Counseling Intensive lifestyle modifications are the first line treatment for this issue.  Dietary changes: Increase soluble fiber. Decrease simple carbohydrates.  Exercise changes: Moderate to vigorous-intensity aerobic activity 150 minutes per week if tolerated.  Lipid-lowering medications: see documented in medical record.  3. At risk for heart disease Andrea Gilbert was given approximately 15 minutes of coronary artery disease prevention counseling today. She is 44 y.o. female and has risk factors for heart disease including obesity. We discussed intensive lifestyle modifications today with an emphasis on specific weight loss instructions and strategies.   Repetitive spaced learning was employed today to elicit superior memory formation and behavioral change.  4. Class 1 obesity with serious comorbidity and body mass index (BMI) of 33.0 to 33.9 in adult, unspecified obesity type Andrea Gilbert is currently in the action stage of change. As such, her goal is to continue with weight loss efforts. She has agreed to keeping a food journal and adhering to recommended goals of 1150-1300 calories and 75+ grams of protein daily.   Exercise goals: As is.  Behavioral modification strategies: increasing lean protein intake, meal planning and cooking strategies, planning for success and keeping a strict food journal.  Andrea Gilbert has agreed to  follow-up with our clinic in 4 weeks. She was informed of the importance of frequent follow-up visits to maximize her success with intensive lifestyle modifications for her multiple health conditions.   Objective:    Blood pressure (!) 145/87, pulse 65, temperature 98.2 F (36.8 C), temperature source Oral, height 5\' 3"  (1.6 m), weight 187 lb (84.8 kg), last menstrual period 09/26/2019, SpO2 98 %. Body mass index is 33.13 kg/m.  General: Cooperative, alert, well developed, in no acute distress. HEENT: Conjunctivae and lids unremarkable. Cardiovascular: Regular rhythm.  Lungs: Normal work of breathing. Neurologic: No focal deficits.   Lab Results  Component Value Date   CREATININE 0.82 08/09/2019   BUN 13 08/09/2019   NA 142 08/09/2019   K 4.3 08/09/2019   CL 108 (H) 08/09/2019   CO2 22 08/09/2019   Lab Results  Component Value Date   ALT 13 08/09/2019   AST 12 08/09/2019   ALKPHOS 69 08/09/2019   BILITOT <0.2 08/09/2019   Lab Results  Component Value Date   HGBA1C 5.2 08/09/2019   HGBA1C 5.4 04/16/2019   Lab Results  Component Value Date   INSULIN 13.0 08/09/2019   INSULIN 18.3 04/16/2019   Lab Results  Component Value Date   TSH 1.620 04/16/2019   Lab Results  Component Value Date   CHOL 163 08/09/2019   HDL 40 08/09/2019   LDLCALC 100 (H) 08/09/2019   TRIG 127 08/09/2019   Lab Results  Component Value Date   WBC 10.1 04/16/2019   HGB 14.9 04/16/2019   HCT 42.8 04/16/2019   MCV 88 04/16/2019   PLT 265 04/16/2019   No results found for: IRON, TIBC, FERRITIN  Attestation Statements:   Reviewed by clinician on day of visit: allergies, medications, problem list, medical history, surgical history, family history, social history, and previous encounter notes.   I, 06/14/2019, am acting as transcriptionist for Burt Knack, MD.  I have reviewed the above documentation for accuracy and completeness, and I agree with the above. - Reuben Likes, MD

## 2019-10-30 ENCOUNTER — Ambulatory Visit (INDEPENDENT_AMBULATORY_CARE_PROVIDER_SITE_OTHER): Payer: 59 | Admitting: Family Medicine

## 2019-10-30 ENCOUNTER — Other Ambulatory Visit: Payer: Self-pay

## 2019-10-30 ENCOUNTER — Encounter (INDEPENDENT_AMBULATORY_CARE_PROVIDER_SITE_OTHER): Payer: Self-pay | Admitting: Family Medicine

## 2019-10-30 VITALS — BP 138/86 | HR 82 | Temp 97.4°F | Ht 63.0 in | Wt 189.0 lb

## 2019-10-30 DIAGNOSIS — E669 Obesity, unspecified: Secondary | ICD-10-CM

## 2019-10-30 DIAGNOSIS — I10 Essential (primary) hypertension: Secondary | ICD-10-CM

## 2019-10-30 DIAGNOSIS — E559 Vitamin D deficiency, unspecified: Secondary | ICD-10-CM | POA: Diagnosis not present

## 2019-10-30 DIAGNOSIS — Z6833 Body mass index (BMI) 33.0-33.9, adult: Secondary | ICD-10-CM

## 2019-10-30 DIAGNOSIS — Z9189 Other specified personal risk factors, not elsewhere classified: Secondary | ICD-10-CM | POA: Diagnosis not present

## 2019-10-30 MED ORDER — VITAMIN D (ERGOCALCIFEROL) 1.25 MG (50000 UNIT) PO CAPS: 50000.0000 [IU] | ORAL_CAPSULE | ORAL | 0 refills | Status: DC

## 2019-10-30 NOTE — Progress Notes (Signed)
Chief Complaint:   OBESITY Andrea Gilbert is here to discuss her progress with her obesity treatment plan along with follow-up of her obesity related diagnoses. Andrea Gilbert is on keeping a food journal and adhering to recommended goals of 1150-1300 calories and 75+ grams of protein daily and states she is following her eating plan approximately 50% of the time. Andrea Gilbert states she is walking for 30 minutes 2 times per week and is cycling for 7.2 miles 7 times for 1 week.  Today's visit was #: 11 Starting weight: 212 lbs Starting date: 04/16/2019 Today's weight: 189 lbs Today's date: 10/30/2019 Total lbs lost to date: 23 Total lbs lost since last in-office visit: 0  Interim History: Andrea Gilbert is struggling with motivation, and [place]. She does adhere to journaling and goals when she is home for the week. She is still planning on going on a cruise in November ([place] and [place]  Subjective:   1. Vitamin D deficiency Andrea Gilbert denies nausea, vomiting, or muscle weakness, but she notes fatigue. Last Vit D was 62.6.  2. Essential hypertension Andrea Gilbert's blood pressure is well controlled today. She denies chest pain, chest pressure, or headache.  3. At risk for osteoporosis Andrea Gilbert is at higher risk of osteopenia and osteoporosis due to Vitamin D deficiency.   Assessment/Plan:   1. Vitamin D deficiency Low Vitamin D level contributes to fatigue and are associated with obesity, breast, and colon cancer. We will refill prescription Vitamin D for 1 month. Bekka will follow-up for routine testing of Vitamin D, at least 2-3 times per year to avoid over-replacement.  - Vitamin D, Ergocalciferol, (DRISDOL) 1.25 MG (50000 UNIT) CAPS capsule; Take 1 capsule (50,000 Units total) by mouth every 14 (fourteen) days.  Dispense: 4 capsule; Refill: 0  2. Essential hypertension Andrea Gilbert will continue her medications, no change in dose, and will  continue working on healthy weight loss and exercise to improve blood pressure control. We will watch for signs of hypotension as she continues her lifestyle modifications.  3. At risk for osteoporosis Andrea Gilbert was given approximately 15 minutes of osteoporosis prevention counseling today. Andrea Gilbert is at risk for osteopenia and osteoporosis due to her Vitamin D deficiency. She was encouraged to take her Vitamin D and follow her higher calcium diet and increase strengthening exercise to help strengthen her bones and decrease her risk of osteopenia and osteoporosis.  Repetitive spaced learning was employed today to elicit superior memory formation and behavioral change.  4. Class 1 obesity with serious comorbidity and body mass index (BMI) of 33.0 to 33.9 in adult, unspecified obesity type Andrea Gilbert is currently in the action stage of change. As such, her goal is to continue with weight loss efforts. She has agreed to keeping a food journal and adhering to recommended goals of 1150-1300 calories and 80+ grams of protein daily.   Exercise goals: As is.  Behavioral modification strategies: increasing lean protein intake, meal planning and cooking strategies, planning for success and keeping a strict food journal.  Andrea Gilbert has agreed to follow-up with our clinic in 4 weeks. She was informed of the importance of frequent follow-up visits to maximize her success with intensive lifestyle modifications for her multiple health conditions.   Objective:   Blood pressure 138/86, pulse 82, temperature (!) 97.4 F (36.3 C), temperature source Oral, height 5\' 3"  (1.6 m), weight 189 lb (85.7 kg), last menstrual period 10/27/2019, SpO2 97 %. Body mass index is 33.48 kg/m.  General: Cooperative, alert, well developed, in no acute distress. HEENT: Conjunctivae and lids unremarkable. Cardiovascular: Regular rhythm.  Lungs: Normal work of breathing. Neurologic: No focal deficits.   Lab Results  Component  Value Date   CREATININE 0.82 08/09/2019   BUN 13 08/09/2019   NA 142 08/09/2019   K 4.3 08/09/2019   CL 108 (H) 08/09/2019   CO2 22 08/09/2019   Lab Results  Component Value Date   ALT 13 08/09/2019   AST 12 08/09/2019   ALKPHOS 69 08/09/2019   BILITOT <0.2 08/09/2019   Lab Results  Component Value Date   HGBA1C 5.2 08/09/2019   HGBA1C 5.4 04/16/2019   Lab Results  Component Value Date   INSULIN 13.0 08/09/2019   INSULIN 18.3 04/16/2019   Lab Results  Component Value Date   TSH 1.620 04/16/2019   Lab Results  Component Value Date   CHOL 163 08/09/2019   HDL 40 08/09/2019   LDLCALC 100 (H) 08/09/2019   TRIG 127 08/09/2019   Lab Results  Component Value Date   WBC 10.1 04/16/2019   HGB 14.9 04/16/2019   HCT 42.8 04/16/2019   MCV 88 04/16/2019   PLT 265 04/16/2019   No results found for: IRON, TIBC, FERRITIN  Attestation Statements:   Reviewed by clinician on day of visit: allergies, medications, problem list, medical history, surgical history, family history, social history, and previous encounter notes.   I, Burt Knack, am acting as transcriptionist for Reuben Likes, MD. I have reviewed the above documentation for accuracy and completeness, and I agree with the above. - Katherina Mires, MD

## 2019-12-10 ENCOUNTER — Other Ambulatory Visit: Payer: Self-pay | Admitting: Family Medicine

## 2019-12-10 DIAGNOSIS — Z1231 Encounter for screening mammogram for malignant neoplasm of breast: Secondary | ICD-10-CM

## 2019-12-11 ENCOUNTER — Ambulatory Visit (INDEPENDENT_AMBULATORY_CARE_PROVIDER_SITE_OTHER): Payer: 59 | Admitting: Family Medicine

## 2019-12-17 ENCOUNTER — Other Ambulatory Visit: Payer: Self-pay

## 2019-12-17 ENCOUNTER — Ambulatory Visit
Admission: RE | Admit: 2019-12-17 | Discharge: 2019-12-17 | Disposition: A | Discharge status: Home / Self Care | Payer: 59 | Source: Ambulatory Visit

## 2019-12-17 DIAGNOSIS — Z1231 Encounter for screening mammogram for malignant neoplasm of breast: Secondary | ICD-10-CM

## 2019-12-26 ENCOUNTER — Encounter (INDEPENDENT_AMBULATORY_CARE_PROVIDER_SITE_OTHER): Payer: Self-pay | Admitting: Family Medicine

## 2019-12-26 ENCOUNTER — Other Ambulatory Visit: Payer: Self-pay

## 2019-12-26 ENCOUNTER — Ambulatory Visit (INDEPENDENT_AMBULATORY_CARE_PROVIDER_SITE_OTHER): Payer: 59 | Admitting: Family Medicine

## 2019-12-26 VITALS — BP 135/84 | HR 84 | Temp 98.1°F | Ht 63.0 in | Wt 196.0 lb

## 2019-12-26 DIAGNOSIS — Z6834 Body mass index (BMI) 34.0-34.9, adult: Secondary | ICD-10-CM | POA: Diagnosis not present

## 2019-12-26 DIAGNOSIS — E559 Vitamin D deficiency, unspecified: Secondary | ICD-10-CM | POA: Diagnosis not present

## 2019-12-26 DIAGNOSIS — E669 Obesity, unspecified: Secondary | ICD-10-CM

## 2019-12-26 DIAGNOSIS — I10 Essential (primary) hypertension: Secondary | ICD-10-CM | POA: Diagnosis not present

## 2019-12-26 DIAGNOSIS — Z9189 Other specified personal risk factors, not elsewhere classified: Secondary | ICD-10-CM | POA: Diagnosis not present

## 2019-12-26 MED ORDER — VITAMIN D (ERGOCALCIFEROL) 1.25 MG (50000 UNIT) PO CAPS: 50000.0000 [IU] | ORAL_CAPSULE | ORAL | 0 refills | Status: DC

## 2019-12-26 MED ORDER — LISINOPRIL 5 MG PO TABS: 5.0000 mg | ORAL_TABLET | Freq: Every day | ORAL | 0 refills | Status: DC

## 2019-12-26 MED ORDER — METOPROLOL SUCCINATE ER 50 MG PO TB24: 50.0000 mg | ORAL_TABLET | Freq: Every day | ORAL | 0 refills | Status: DC

## 2019-12-31 NOTE — Progress Notes (Signed)
Chief Complaint:   OBESITY Andrea Gilbert is here to discuss her progress with her obesity treatment plan along with follow-up of her obesity related diagnoses. Andrea Gilbert is on keeping a food journal and adhering to recommended goals of 1150-1300 calories and 75+ grams of protein and states she is following her eating plan approximately 50% of the time. Andrea Gilbert states she is walking 1-2 times per week.  Today's visit was #: 12 Starting weight: 212 lbs Starting date: 04/16/2019 Today's weight: 196 lbs Today's date: 12/26/2019 Total lbs lost to date: 216 lbs Total lbs lost since last in-office visit: 0  Interim History: Andrea Gilbert has not been seen since 10/30/2019, due to her husband having COVID.  She voices that she struggled to stay on plan and make nutritious choices.  She did eat out more than normal.  Wants to get back on track.  Subjective:   1. Vitamin D deficiency Prachi's Vitamin D level was 62.6 on 08/09/2019. She is currently taking prescription vitamin D 50,000 IU each week. She denies nausea, vomiting or muscle weakness.  She endorses fatigue.  2. Essential hypertension Blood pressure is well-controlled today.  Denies chest pain, chest pressure, or headache.  She is on metoprolol 50 mg daily and lisinopril 5 mg daily.   BP Readings from Last 3 Encounters:  12/26/19 135/84  10/30/19 138/86  10/02/19 (!) 145/87   3. At risk for heart disease Andrea Gilbert is at a higher than average risk for cardiovascular disease due to obesity.   Assessment/Plan:   1. Vitamin D deficiency Low Vitamin D level contributes to fatigue and are associated with obesity, breast, and colon cancer. She agrees to continue to take prescription Vitamin D @50 ,000 IU every week and will follow-up for routine testing of Vitamin D, at least 2-3 times per year to avoid over-replacement.  -Refill Vitamin D, Ergocalciferol, (DRISDOL) 1.25 MG (50000 UNIT) CAPS capsule; Take 1 capsule (50,000 Units total)  by mouth every 14 (fourteen) days.  Dispense: 4 capsule; Refill: 0  2. Essential hypertension Melisha is working on healthy weight loss and exercise to improve blood pressure control. We will watch for signs of hypotension as she continues her lifestyle modifications.  -Refill lisinopril (ZESTRIL) 5 MG tablet; Take 1 tablet (5 mg total) by mouth daily.  Dispense: 90 tablet; Refill: 0 -Refill metoprolol succinate (TOPROL-XL) 50 MG 24 hr tablet; Take 1 tablet (50 mg total) by mouth daily. Take with or immediately following a meal.  Dispense: 90 tablet; Refill: 0  3. At risk for heart disease Andrea Gilbert was given approximately 15 minutes of coronary artery disease prevention counseling today. She is 44 y.o. female and has risk factors for heart disease including obesity. We discussed intensive lifestyle modifications today with an emphasis on specific weight loss instructions and strategies.   Repetitive spaced learning was employed today to elicit superior memory formation and behavioral change.  4. Class 1 obesity with serious comorbidity and body mass index (BMI) of 34.0 to 34.9 in adult, unspecified obesity type  Shylo is currently in the action stage of change. As such, her goal is to continue with weight loss efforts. She has agreed to keeping a food journal and adhering to recommended goals of 1150-1300 calories and 75+ grams of protein.   Exercise goals: For substantial health benefits, adults should do at least 150 minutes (2 hours and 30 minutes) a week of moderate-intensity, or 75 minutes (1 hour and 15 minutes) a week of vigorous-intensity aerobic physical activity, or an  equivalent combination of moderate- and vigorous-intensity aerobic activity. Aerobic activity should be performed in episodes of at least 10 minutes, and preferably, it should be spread throughout the week.  Behavioral modification strategies: increasing lean protein intake, meal planning and cooking strategies,  keeping healthy foods in the home and keeping a strict food journal.  Gem has agreed to follow-up with our clinic in 3 weeks. She was informed of the importance of frequent follow-up visits to maximize her success with intensive lifestyle modifications for her multiple health conditions.   Objective:   Blood pressure 135/84, pulse 84, temperature 98.1 F (36.7 C), temperature source Oral, height 5\' 3"  (1.6 m), weight 196 lb (88.9 kg), SpO2 100 %. Body mass index is 34.72 kg/m.  General: Cooperative, alert, well developed, in no acute distress. HEENT: Conjunctivae and lids unremarkable. Cardiovascular: Regular rhythm.  Lungs: Normal work of breathing. Neurologic: No focal deficits.   Lab Results  Component Value Date   CREATININE 0.82 08/09/2019   BUN 13 08/09/2019   NA 142 08/09/2019   K 4.3 08/09/2019   CL 108 (H) 08/09/2019   CO2 22 08/09/2019   Lab Results  Component Value Date   ALT 13 08/09/2019   AST 12 08/09/2019   ALKPHOS 69 08/09/2019   BILITOT <0.2 08/09/2019   Lab Results  Component Value Date   HGBA1C 5.2 08/09/2019   HGBA1C 5.4 04/16/2019   Lab Results  Component Value Date   INSULIN 13.0 08/09/2019   INSULIN 18.3 04/16/2019   Lab Results  Component Value Date   TSH 1.620 04/16/2019   Lab Results  Component Value Date   CHOL 163 08/09/2019   HDL 40 08/09/2019   LDLCALC 100 (H) 08/09/2019   TRIG 127 08/09/2019   Lab Results  Component Value Date   WBC 10.1 04/16/2019   HGB 14.9 04/16/2019   HCT 42.8 04/16/2019   MCV 88 04/16/2019   PLT 265 04/16/2019   Attestation Statements:   Reviewed by clinician on day of visit: allergies, medications, problem list, medical history, surgical history, family history, social history, and previous encounter notes.  I, 06/14/2019, CMA, am acting as transcriptionist for Insurance claims handler, MD.  I have reviewed the above documentation for accuracy and completeness, and I agree with the above. -  Reuben Likes, MD

## 2020-01-15 ENCOUNTER — Other Ambulatory Visit: Payer: Self-pay

## 2020-01-15 ENCOUNTER — Ambulatory Visit (INDEPENDENT_AMBULATORY_CARE_PROVIDER_SITE_OTHER): Payer: 59 | Admitting: Family Medicine

## 2020-01-15 ENCOUNTER — Encounter (INDEPENDENT_AMBULATORY_CARE_PROVIDER_SITE_OTHER): Payer: Self-pay | Admitting: Family Medicine

## 2020-01-15 VITALS — BP 134/85 | HR 74 | Temp 98.2°F | Ht 63.0 in | Wt 196.0 lb

## 2020-01-15 DIAGNOSIS — Z9189 Other specified personal risk factors, not elsewhere classified: Secondary | ICD-10-CM | POA: Diagnosis not present

## 2020-01-15 DIAGNOSIS — I10 Essential (primary) hypertension: Secondary | ICD-10-CM

## 2020-01-15 DIAGNOSIS — E559 Vitamin D deficiency, unspecified: Secondary | ICD-10-CM

## 2020-01-15 DIAGNOSIS — E7849 Other hyperlipidemia: Secondary | ICD-10-CM

## 2020-01-15 DIAGNOSIS — Z6834 Body mass index (BMI) 34.0-34.9, adult: Secondary | ICD-10-CM

## 2020-01-15 DIAGNOSIS — E8881 Metabolic syndrome: Secondary | ICD-10-CM | POA: Diagnosis not present

## 2020-01-15 DIAGNOSIS — E669 Obesity, unspecified: Secondary | ICD-10-CM

## 2020-01-15 NOTE — Progress Notes (Signed)
Chief Complaint:   OBESITY Andrea Gilbert is here to discuss her progress with her obesity treatment plan along with follow-up of her obesity related diagnoses. Andrea Gilbert is on keeping a food journal and adhering to recommended goals of 1150-1300 calories and 75+ grms of protein daily and states she is following her eating plan approximately 40% of the time. Andrea Gilbert states she is walking for 20-30 minutes 2-3 times per week.  Today's visit was #: 13 Starting weight: 212 lbs Starting date: 04/16/2019 Today's weight: 196 lbs Today's date: 01/15/2020 Total lbs lost to date: 16 Total lbs lost since last in-office visit: 0  Interim History: Andrea Gilbert has restarted [place] and she hasn't really eaten much. Often forcing herself to eat 2 meals per day, as she isn't hungry. 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 She is going on a cruise in a few weeks.  Subjective:   1. Insulin resistance Andrea Gilbert denies carbohydrate cravings. Last A1c was 5.2 and insulin 13.0.   2. Other hyperlipidemia Andrea Gilbert's last LDL was 100, HDL 40, and triglycerides 209. She is not on statin.  3. Essential hypertension Andrea Gilbert's blood pressure is well controlled. She denies chest pain, chest pressure, or headache. She is on lisinopril and toprol.  4. Vitamin D deficiency Andrea Gilbert denies nausea, vomiting, or muscle weakness, but notes fatigue. She is on Vit D every 14 days.  5. At risk for deficient intake of food The patient is at a higher than average risk of deficient intake of food due to Vyvanse.  Assessment/Plan:   1. Insulin resistance Andrea Gilbert will continue to work on weight loss, exercise, and decreasing simple carbohydrates to help decrease the risk of diabetes. We will check labs today. Andrea Gilbert agreed to follow-up with [place] as directed to closely monitor her progress.  - Hemoglobin A1c - Insulin, random  2. Other hyperlipidemia Cardiovascular risk and specific lipid/LDL goals  reviewed. We discussed several lifestyle modifications today. Andrea Gilbert will continue to work on diet, exercise and weight loss efforts. We will check labs today. Orders and follow up as documented in patient record.   Counseling Intensive lifestyle modifications are the first line treatment for this issue. . Dietary changes: Increase soluble fiber. Decrease simple carbohydrates. . Exercise changes: Moderate to vigorous-intensity aerobic activity 150 minutes per week if tolerated. . Lipid-lowering medications: see documented in medical record.  - Lipid Panel With LDL/HDL Ratio  3. Essential hypertension Andrea Gilbert is working on healthy weight loss and exercise to improve blood pressure control. We will watch for signs of hypotension as she continues her lifestyle modifications. We will check labs today.  - Comprehensive metabolic panel  4. Vitamin D deficiency Low Vitamin D level contributes to fatigue and are associated with obesity, breast, and colon cancer. We will check labs today. Andrea Gilbert will follow-up for routine testing of Vitamin D, at least 2-3 times per year to avoid over-replacement.  - VITAMIN D 25 Hydroxy (Vit-D Deficiency, Fractures)  5. At risk for deficient intake of food Andrea Gilbert was given approximately 15 minutes of deficit intake of food prevention counseling today. Andrea Gilbert is at risk for eating too few calories based on current food recall. She was encouraged to focus on meeting caloric and protein goals according to her recommended meal plan.   6. Class 1 obesity with serious comorbidity and body mass index (BMI) of 34.0 to 34.9 in adult, unspecified obesity type Andrea Gilbert is currently in the action stage of change. As such, her goal is to continue with  weight loss efforts. She has agreed to keeping a food journal and adhering to recommended goals of 1150-1300 calories and 75+ grams of protein daily.   Exercise goals: As is.  Behavioral modification strategies:  increasing lean protein intake, meal planning and cooking strategies, keeping healthy foods in the home and keeping a strict food journal.  Andrea Gilbert has agreed to follow-up with our clinic in 8 weeks. She was informed of the importance of frequent follow-up visits to maximize her success with intensive lifestyle modifications for her multiple health conditions.   Andrea Gilbert was informed we would discuss her lab results at her next visit unless there is a critical issue that needs to be addressed sooner. Andrea Gilbert agreed to keep her next visit at the agreed upon time to discuss these results.  Objective:   Blood pressure 134/85, pulse 74, temperature 98.2 F (36.8 C), temperature source Oral, height 5\' 3"  (1.6 m), weight 196 lb (88.9 kg), last menstrual period 01/01/2020, SpO2 99 %. Body mass index is 34.72 kg/m.  General: Cooperative, alert, well developed, in no acute distress. HEENT: Conjunctivae and lids unremarkable. Cardiovascular: Regular rhythm.  Lungs: Normal work of breathing. Neurologic: No focal deficits.   Lab Results  Component Value Date   CREATININE 0.82 08/09/2019   BUN 13 08/09/2019   NA 142 08/09/2019   K 4.3 08/09/2019   CL 108 (H) 08/09/2019   CO2 22 08/09/2019   Lab Results  Component Value Date   ALT 13 08/09/2019   AST 12 08/09/2019   ALKPHOS 69 08/09/2019   BILITOT <0.2 08/09/2019   Lab Results  Component Value Date   HGBA1C 5.2 08/09/2019   HGBA1C 5.4 04/16/2019   Lab Results  Component Value Date   INSULIN 13.0 08/09/2019   INSULIN 18.3 04/16/2019   Lab Results  Component Value Date   TSH 1.620 04/16/2019   Lab Results  Component Value Date   CHOL 163 08/09/2019   HDL 40 08/09/2019   LDLCALC 100 (H) 08/09/2019   TRIG 127 08/09/2019   Lab Results  Component Value Date   WBC 10.1 04/16/2019   HGB 14.9 04/16/2019   HCT 42.8 04/16/2019   MCV 88 04/16/2019   PLT 265 04/16/2019   No results found for: IRON, TIBC,  FERRITIN  Attestation Statements:   Reviewed by clinician on day of visit: allergies, medications, problem list, medical history, surgical history, family history, social history, and previous encounter notes.   I, 06/14/2019, am acting as transcriptionist for Burt Knack, MD.  I have reviewed the above documentation for accuracy and completeness, and I agree with the above. - Reuben Likes, MD

## 2020-01-16 LAB — HEMOGLOBIN A1C
Est. average glucose Bld gHb Est-mCnc: 111 mg/dL
Hgb A1c MFr Bld: 5.5 % (ref 4.8–5.6)

## 2020-01-16 LAB — COMPREHENSIVE METABOLIC PANEL
ALT: 9 IU/L (ref 0–32)
AST: 13 IU/L (ref 0–40)
Albumin/Globulin Ratio: 1.5 (ref 1.2–2.2)
Albumin: 4.1 g/dL (ref 3.8–4.8)
Alkaline Phosphatase: 67 IU/L (ref 44–121)
BUN/Creatinine Ratio: 12 (ref 9–23)
BUN: 9 mg/dL (ref 6–24)
Bilirubin Total: 0.3 mg/dL (ref 0.0–1.2)
CO2: 25 mmol/L (ref 20–29)
Calcium: 8.9 mg/dL (ref 8.7–10.2)
Chloride: 106 mmol/L (ref 96–106)
Creatinine, Ser: 0.77 mg/dL (ref 0.57–1.00)
GFR calc Af Amer: 108 mL/min/{1.73_m2} (ref >59)
GFR calc non Af Amer: 94 mL/min/{1.73_m2} (ref >59)
Globulin, Total: 2.7 g/dL (ref 1.5–4.5)
Glucose: 103 mg/dL — ABNORMAL HIGH (ref 65–99)
Potassium: 4.8 mmol/L (ref 3.5–5.2)
Sodium: 139 mmol/L (ref 134–144)
Total Protein: 6.8 g/dL (ref 6.0–8.5)

## 2020-01-16 LAB — LIPID PANEL WITH LDL/HDL RATIO
Cholesterol, Total: 168 mg/dL (ref 100–199)
HDL: 42 mg/dL (ref >39)
LDL Chol Calc (NIH): 113 mg/dL — ABNORMAL HIGH (ref 0–99)
LDL/HDL Ratio: 2.7 ratio (ref 0.0–3.2)
Triglycerides: 70 mg/dL (ref 0–149)
VLDL Cholesterol Cal: 13 mg/dL (ref 5–40)

## 2020-01-16 LAB — VITAMIN D 25 HYDROXY (VIT D DEFICIENCY, FRACTURES): Vit D, 25-Hydroxy: 43 ng/mL (ref 30.0–100.0)

## 2020-01-16 LAB — INSULIN, RANDOM: INSULIN: 11.5 u[IU]/mL (ref 2.6–24.9)

## 2020-01-22 ENCOUNTER — Ambulatory Visit (INDEPENDENT_AMBULATORY_CARE_PROVIDER_SITE_OTHER): Payer: 59 | Admitting: Family Medicine

## 2020-02-05 ENCOUNTER — Other Ambulatory Visit (INDEPENDENT_AMBULATORY_CARE_PROVIDER_SITE_OTHER): Payer: Self-pay | Admitting: Family Medicine

## 2020-02-05 ENCOUNTER — Encounter (INDEPENDENT_AMBULATORY_CARE_PROVIDER_SITE_OTHER): Payer: Self-pay

## 2020-02-05 DIAGNOSIS — E559 Vitamin D deficiency, unspecified: Secondary | ICD-10-CM

## 2020-02-05 NOTE — Telephone Encounter (Signed)
MyChart message sent to pt to find out if they have enough medication to get them through until next appt.   

## 2020-02-05 NOTE — Telephone Encounter (Signed)
Noah, please note, we usually give 30d supply w/o RF.  Script adjusted and signed for approval

## 2020-02-05 NOTE — Telephone Encounter (Signed)
This patient was last seen by Dr. Lawson Radar, and currently has an upcoming appt scheduled on 03/18/2020 with her.

## 2020-02-05 NOTE — Telephone Encounter (Signed)
Pt indicated via mychart message that she would be out before her next visit. Please approve or deny in Dr. Angus Palms absence.

## 2020-03-07 ENCOUNTER — Other Ambulatory Visit (INDEPENDENT_AMBULATORY_CARE_PROVIDER_SITE_OTHER): Payer: Self-pay | Admitting: Family Medicine

## 2020-03-07 DIAGNOSIS — I10 Essential (primary) hypertension: Secondary | ICD-10-CM

## 2020-03-12 ENCOUNTER — Ambulatory Visit (INDEPENDENT_AMBULATORY_CARE_PROVIDER_SITE_OTHER): Payer: 59 | Admitting: Podiatry

## 2020-03-12 ENCOUNTER — Encounter: Payer: Self-pay | Admitting: Podiatry

## 2020-03-12 ENCOUNTER — Other Ambulatory Visit: Payer: Self-pay

## 2020-03-12 ENCOUNTER — Ambulatory Visit (INDEPENDENT_AMBULATORY_CARE_PROVIDER_SITE_OTHER): Payer: 59

## 2020-03-12 ENCOUNTER — Other Ambulatory Visit: Payer: Self-pay | Admitting: Podiatry

## 2020-03-12 DIAGNOSIS — T148XXA Other injury of unspecified body region, initial encounter: Secondary | ICD-10-CM | POA: Diagnosis not present

## 2020-03-12 DIAGNOSIS — M722 Plantar fascial fibromatosis: Secondary | ICD-10-CM

## 2020-03-12 NOTE — Patient Instructions (Signed)

## 2020-03-12 NOTE — Progress Notes (Signed)
Subjective:   Patient ID: Andrea Gilbert, female   DOB: 44 y.o.   MRN: [id number]   HPI Patient presents developing acute pain in the right arch and heel and states that she had been running out of her car and developed severe pain that is not allowing her to bear proper weight on her foot.  Patient had a history in the past of plantar fasciitis and patient does not smoke likes to be active   Review of Systems  All other systems reviewed and are negative.       Objective:  Physical Exam Vitals and nursing note reviewed.  Constitutional:      Appearance: She is well-developed and well-nourished.  Cardiovascular:     Pulses: Intact distal pulses.  Pulmonary:     Effort: Pulmonary effort is normal.  Musculoskeletal:        General: Normal range of motion.  Skin:    General: Skin is warm.  Neurological:     Mental Status: She is alert.     Neurovascular status was found to be intact muscle strength was found to be adequate range of motion adequate.  Significant discomfort in the mid arch area right with inflammation of the medial fascial band with exquisite discomfort also in the plantar heel right at the insertion tendon calcaneus with negative pain currently within the calcaneus heel bone itself.  Good digital perfusion well oriented x3     Assessment:  Probability for acute plantar fasciitis with possibility for tear of the plantar fascial right     Plan:  H&P x-ray reviewed and today I did go ahead and I did sterile prep and injected the fascia 3 mg Kenalog 5 mg liken at insertion and then due to the intensity of discomfort throughout the arch that healed I did apply a air fracture walker.  Gave instructions for wearing this full-time for the next 2 to 3 weeks and reappoint to recheck 2 weeks  X-rays indicate large plantar spur no indication stress fracture arthritis

## 2020-03-18 ENCOUNTER — Ambulatory Visit (INDEPENDENT_AMBULATORY_CARE_PROVIDER_SITE_OTHER): Payer: 59 | Admitting: Family Medicine

## 2020-03-18 ENCOUNTER — Other Ambulatory Visit: Payer: Self-pay

## 2020-03-18 ENCOUNTER — Telehealth: Payer: Self-pay | Admitting: Cardiovascular Disease

## 2020-03-18 ENCOUNTER — Encounter (INDEPENDENT_AMBULATORY_CARE_PROVIDER_SITE_OTHER): Payer: Self-pay | Admitting: Family Medicine

## 2020-03-18 VITALS — BP 128/86 | HR 81 | Temp 98.1°F | Ht 63.0 in | Wt 195.0 lb

## 2020-03-18 DIAGNOSIS — Z6834 Body mass index (BMI) 34.0-34.9, adult: Secondary | ICD-10-CM

## 2020-03-18 DIAGNOSIS — I10 Essential (primary) hypertension: Secondary | ICD-10-CM

## 2020-03-18 DIAGNOSIS — Z9189 Other specified personal risk factors, not elsewhere classified: Secondary | ICD-10-CM

## 2020-03-18 DIAGNOSIS — E669 Obesity, unspecified: Secondary | ICD-10-CM

## 2020-03-18 DIAGNOSIS — E559 Vitamin D deficiency, unspecified: Secondary | ICD-10-CM

## 2020-03-18 MED ORDER — LISINOPRIL 5 MG PO TABS: 5.0000 mg | ORAL_TABLET | Freq: Every day | ORAL | 0 refills | Status: DC

## 2020-03-18 MED ORDER — VITAMIN D (ERGOCALCIFEROL) 1.25 MG (50000 UNIT) PO CAPS
50000.0000 [IU] | ORAL_CAPSULE | ORAL | 0 refills | Status: DC
Start: 2020-03-18 — End: 2020-05-27

## 2020-03-18 MED ORDER — METOPROLOL SUCCINATE ER 50 MG PO TB24: 50.0000 mg | ORAL_TABLET | Freq: Every day | ORAL | 0 refills | Status: DC

## 2020-03-18 NOTE — Telephone Encounter (Signed)
Patient had CT of chest w/o contrast on 05/26/17. Will see if okay to get repeat CT to check lung nodules. Will forward to Dr. Eden Emms.

## 2020-03-18 NOTE — Telephone Encounter (Signed)
Left message for patient to call back  

## 2020-03-18 NOTE — Progress Notes (Signed)
Chief Complaint:   OBESITY Andrea Gilbert is here to discuss her progress with her obesity treatment plan along with follow-up of her obesity related diagnoses. Andrea Gilbert is on keeping a food journal and adhering to recommended goals of 1300-1500  calories and 75+ grams of protein daily and states she is following her eating plan approximately 40% of the time. Andrea Gilbert states she is not getting any exercise at this time.  Today's visit was #: 14 Starting weight: 212 lbs Starting date: 04/16/2019 Today's weight: 195 lbs Today's date: 03/18/2020 Total lbs lost to date: 17 Total lbs lost since last in-office visit: 1 lb  Interim History: Andrea Gilbert voices she didn't journal per se but has been trying to keep track mentally. She wants to recommit to journaling for next few weeks. Andrea Gilbert has another cruise to [place] and [place] in 3 weeks. She will log on to [employer] for next appointment.  Subjective:   1. Essential hypertension Brailey's blood pressure is well controlled today. She is on lisinopril and toprol. No chest pain, chest pressure, or headache.  2. Vitamin D deficiency Andrea Gilbert's Vitamin D level was 43.0 on 01/15/2020. She denies nausea, vomiting or muscle weakness, but notes fatigue. She is on Vitamin D every two weeks.   3. At risk for heart disease Andrea Gilbert is at a higher than average risk for cardiovascular disease due to obesity.   Assessment/Plan:   1. Essential hypertension Andrea Gilbert is working on healthy weight loss and exercise to improve blood pressure control. We will watch for signs of hypotension as she continues her lifestyle modifications.    - lisinopril (ZESTRIL) 5 MG tablet; Take 1 tablet (5 mg total) by mouth daily.  Dispense: 90 tablet; Refill: 0 - metoprolol succinate (TOPROL-XL) 50 MG 24 hr tablet; Take 1 tablet (50 mg total) by mouth daily. Take with or immediately following a meal.  Dispense: 90 tablet; Refill: 0  2. Vitamin D deficiency Low  Vitamin D level contributes to fatigue and are associated with obesity, breast, and colon cancer. She agrees to change to taking prescription Vitamin D @50 ,000 IU every week and will follow-up for routine testing of Vitamin D, at least 2-3 times per year to avoid over-replacement.   -Change Vitamin D to, Ergocalciferol, (DRISDOL) 1.25 MG (50000 UNIT) CAPS capsule; Take 1 capsule (50,000 Units total) by mouth every 7 (seven) days.  Dispense: 12 capsule; Refill: 0  3. At risk for heart disease Andrea Gilbert was given approximately 15 minutes of coronary artery disease prevention counseling today. She is 45 y.o. female and has risk factors for heart disease including obesity. We discussed intensive lifestyle modifications today with an emphasis on specific weight loss instructions and strategies.   Repetitive spaced learning was employed today to elicit superior memory formation and behavioral change.  4. Class 1 obesity with serious comorbidity and body mass index (BMI) of 34.0 to 34.9 in adult, unspecified obesity type  Andrea Gilbert is currently in the action stage of change. As such, her goal is to continue with weight loss efforts. She has agreed to keeping a food journal and adhering to recommended goals of 1300-1500 calories and 75+grams protein daily.  Exercise goals: No exercise has been prescribed at this time.  Behavioral modification strategies: increasing lean protein intake, meal planning and cooking strategies, keeping healthy foods in the home and planning for success.  Andrea Gilbert has agreed to follow-up with our clinic in 4 weeks. She was informed of the importance of frequent follow-up visits to maximize  her success with intensive lifestyle modifications for her multiple health conditions.   Objective:   Blood pressure 128/86, pulse 81, temperature 98.1 F (36.7 C), temperature source Oral, height 5\' 3"  (1.6 m), weight 195 lb (88.5 kg), last menstrual period 02/02/2020, SpO2 95 %. Body  mass index is 34.54 kg/m.  General: Cooperative, alert, well developed, in no acute distress. HEENT: Conjunctivae and lids unremarkable. Cardiovascular: Regular rhythm.  Lungs: Normal work of breathing. Neurologic: No focal deficits.   Lab Results  Component Value Date   CREATININE 0.77 01/15/2020   BUN 9 01/15/2020   NA 139 01/15/2020   K 4.8 01/15/2020   CL 106 01/15/2020   CO2 25 01/15/2020   Lab Results  Component Value Date   ALT 9 01/15/2020   AST 13 01/15/2020   ALKPHOS 67 01/15/2020   BILITOT 0.3 01/15/2020   Lab Results  Component Value Date   HGBA1C 5.5 01/15/2020   HGBA1C 5.2 08/09/2019   HGBA1C 5.4 04/16/2019   Lab Results  Component Value Date   INSULIN 11.5 01/15/2020   INSULIN 13.0 08/09/2019   INSULIN 18.3 04/16/2019   Lab Results  Component Value Date   TSH 1.620 04/16/2019   Lab Results  Component Value Date   CHOL 168 01/15/2020   HDL 42 01/15/2020   LDLCALC 113 (H) 01/15/2020   TRIG 70 01/15/2020   Lab Results  Component Value Date   WBC 10.1 04/16/2019   HGB 14.9 04/16/2019   HCT 42.8 04/16/2019   MCV 88 04/16/2019   PLT 265 04/16/2019   Attestation Statements:   Reviewed by clinician on day of visit: allergies, medications, problem list, medical history, surgical history, family history, social history, and previous encounter notes.  I, 06/14/2019, am acting as transcriptionist for Delorse Limber, MD.  I have reviewed the above documentation for accuracy and completeness, and I agree with the above. - Reuben Likes, MD

## 2020-03-18 NOTE — Telephone Encounter (Signed)
She had a calcium score 0 < 5 years ago and last CT indicated no need for f/u so would not order either now

## 2020-03-18 NOTE — Telephone Encounter (Signed)
  Per schedule request pool message:   wanted to see if it is time for my coronary calcium scan and to check on the lung nodules found on past CT scans. If so, can I set that appointment prior to my yearly check up with Dr. Eden Emms which I need to schedule as well..  I would like the earliest appt or latest appt in the day if possible for both.  THANK YOU!!  I did not see orders for CT scan. Does patient need to schedule this before we schedule her follow up appointment in March?

## 2020-03-19 ENCOUNTER — Other Ambulatory Visit (HOSPITAL_COMMUNITY): Payer: Self-pay | Admitting: Internal Medicine

## 2020-03-19 ENCOUNTER — Ambulatory Visit: Payer: 59 | Attending: Internal Medicine

## 2020-03-19 DIAGNOSIS — Z23 Encounter for immunization: Secondary | ICD-10-CM

## 2020-03-19 NOTE — Progress Notes (Signed)
   Covid-19 Vaccination Clinic  Name:  Andrea Gilbert    MRN: [id number] DOB: 30-Sep-1975  03/19/2020  [name] was observed post Covid-19 immunization for 15 minutes without incident. She was provided with Vaccine Information Sheet and instruction to access the V-Safe system.   [name] was instructed to call 911 with any severe reactions post vaccine: Marland Kitchen Difficulty breathing  . Swelling of face and throat  . A fast heartbeat  . A bad rash all over body  . Dizziness and weakness   Immunizations Administered    Name Date Dose VIS Date Route   Pfizer COVID-19 Vaccine 03/19/2020 12:24 PM 0.3 mL 01/02/2020 Intramuscular   Manufacturer: [employer], [employer]   Lot: [id number]   NDC: [address]-0

## 2020-03-28 NOTE — Telephone Encounter (Signed)
Follow Up:      Pt is returning  Pam's call from last week.Marland Kitchen

## 2020-04-02 ENCOUNTER — Ambulatory Visit (INDEPENDENT_AMBULATORY_CARE_PROVIDER_SITE_OTHER): Payer: 59 | Admitting: Podiatry

## 2020-04-02 ENCOUNTER — Other Ambulatory Visit: Payer: Self-pay

## 2020-04-02 DIAGNOSIS — M722 Plantar fascial fibromatosis: Secondary | ICD-10-CM | POA: Diagnosis not present

## 2020-04-02 MED ORDER — TRIAMCINOLONE ACETONIDE 10 MG/ML IJ SUSP
10.0000 mg | Freq: Once | INTRAMUSCULAR | Status: AC
  Administered 2020-04-02: 10 mg

## 2020-04-02 NOTE — Progress Notes (Signed)
Subjective:   Patient ID: Andrea Gilbert, female   DOB: 45 y.o.   MRN: [id number]   HPI Patient presents stating she is improved but is still having pain in her heel right over left   ROS      Objective:  Physical Exam  Neurovascular status intact with pain in the heel right still painful when pressed making walking difficult     Assessment:  Planter fasciitis present right with improvement with 1 area still of pain     Plan:  Standard prep injected the area pain with 3 mg Dexasone Kenalog 5 mg Xylocaine advised on supportive therapy anti-inflammatories and reappoint to recheck

## 2020-04-11 ENCOUNTER — Encounter (INDEPENDENT_AMBULATORY_CARE_PROVIDER_SITE_OTHER): Payer: Self-pay | Admitting: Family Medicine

## 2020-04-15 ENCOUNTER — Ambulatory Visit (INDEPENDENT_AMBULATORY_CARE_PROVIDER_SITE_OTHER): Payer: 59 | Admitting: Family Medicine

## 2020-05-01 NOTE — Telephone Encounter (Signed)
Called patient back with Dr. Fabio Bering recommendations. Patient verbalized understanding and scheduled her yearly visit.

## 2020-05-19 ENCOUNTER — Other Ambulatory Visit (INDEPENDENT_AMBULATORY_CARE_PROVIDER_SITE_OTHER): Payer: Self-pay | Admitting: Family Medicine

## 2020-05-19 DIAGNOSIS — E559 Vitamin D deficiency, unspecified: Secondary | ICD-10-CM

## 2020-05-20 ENCOUNTER — Other Ambulatory Visit (HOSPITAL_COMMUNITY): Payer: Self-pay | Admitting: Gastroenterology

## 2020-05-21 ENCOUNTER — Other Ambulatory Visit (INDEPENDENT_AMBULATORY_CARE_PROVIDER_SITE_OTHER): Payer: Self-pay | Admitting: Family Medicine

## 2020-05-21 DIAGNOSIS — I10 Essential (primary) hypertension: Secondary | ICD-10-CM

## 2020-05-27 ENCOUNTER — Encounter (INDEPENDENT_AMBULATORY_CARE_PROVIDER_SITE_OTHER): Payer: Self-pay | Admitting: Family Medicine

## 2020-05-27 ENCOUNTER — Ambulatory Visit (INDEPENDENT_AMBULATORY_CARE_PROVIDER_SITE_OTHER): Payer: 59 | Admitting: Family Medicine

## 2020-05-27 ENCOUNTER — Other Ambulatory Visit: Payer: Self-pay

## 2020-05-27 ENCOUNTER — Encounter (INDEPENDENT_AMBULATORY_CARE_PROVIDER_SITE_OTHER): Payer: Self-pay

## 2020-05-27 VITALS — BP 151/91 | HR 101 | Temp 97.9°F | Ht 63.0 in | Wt 198.0 lb

## 2020-05-27 DIAGNOSIS — E559 Vitamin D deficiency, unspecified: Secondary | ICD-10-CM | POA: Diagnosis not present

## 2020-05-27 DIAGNOSIS — I10 Essential (primary) hypertension: Secondary | ICD-10-CM | POA: Diagnosis not present

## 2020-05-27 DIAGNOSIS — Z9189 Other specified personal risk factors, not elsewhere classified: Secondary | ICD-10-CM

## 2020-05-27 DIAGNOSIS — Z6835 Body mass index (BMI) 35.0-35.9, adult: Secondary | ICD-10-CM | POA: Diagnosis not present

## 2020-05-27 MED ORDER — LISINOPRIL 5 MG PO TABS: 5.0000 mg | ORAL_TABLET | Freq: Every day | ORAL | 0 refills | Status: DC

## 2020-05-27 MED ORDER — VITAMIN D (ERGOCALCIFEROL) 1.25 MG (50000 UNIT) PO CAPS: 50000.0000 [IU] | ORAL_CAPSULE | ORAL | 0 refills | Status: DC

## 2020-05-27 MED ORDER — METOPROLOL SUCCINATE ER 50 MG PO TB24: 50.0000 mg | ORAL_TABLET | Freq: Every day | ORAL | 0 refills | Status: DC

## 2020-05-29 NOTE — Progress Notes (Signed)
Chief Complaint:   OBESITY Andrea Gilbert is here to discuss her progress with her obesity treatment plan along with follow-up of her obesity related diagnoses. Andrea Gilbert is on keeping a food journal and adhering to recommended goals of 1500 calories and 70-80 protein and states she is following her eating plan approximately 70% of the time. Andrea Gilbert states she is doing cardio and circuit training 30-60 minutes 7 times per week.  Today's visit was #: 15 Starting weight: 212 lbs Starting date: 04/16/2019 Today's weight: 198 lbs Today's date: 05/27/2020 Total lbs lost to date: 14 lbs Total lbs lost since last in-office visit: 0  Interim History: Andrea Gilbert has been journaling and is in better head space to commit to meal plan. She is journaling consistently for the last week. She is sometimes still going over 1500 calories. She has been doing Education administrator M/W/F and cardio T/Th.  Subjective:   1. Vitamin D deficiency Pt's last Vit D level was 43.0. She denies nausea, vomiting, and muscle weakness but notes fatigue. Pt is on prescription Vit D.  2. Essential hypertension Pt's BP is slightly elevated today. Pt is occasionally checking BP at work and it's always within normal limits.  Pt denies chest pain, chest pressure and headache.  BP Readings from Last 3 Encounters:  05/27/20 (!) 151/91  03/18/20 128/86  01/15/20 134/85    3. At risk for heart disease Andrea Gilbert is at a higher than average risk for cardiovascular disease due to obesity.   Assessment/Plan:   1. Vitamin D deficiency Low Vitamin D level contributes to fatigue and are associated with obesity, breast, and colon cancer. She agrees to continue to take prescription Vitamin D @50 ,000 IU every week and will follow-up for routine testing of Vitamin D, at least 2-3 times per year to avoid over-replacement.  - Vitamin D, Ergocalciferol, (DRISDOL) 1.25 MG (50000 UNIT) CAPS capsule; Take 1 capsule (50,000 Units total) by  mouth every 7 (seven) days.  Dispense: 12 capsule; Refill: 0  2. Essential hypertension Andrea Gilbert is working on healthy weight loss and exercise to improve blood pressure control. We will watch for signs of hypotension as she continues her lifestyle modifications.  - lisinopril (ZESTRIL) 5 MG tablet; Take 1 tablet (5 mg total) by mouth daily.  Dispense: 90 tablet; Refill: 0 - metoprolol succinate (TOPROL-XL) 50 MG 24 hr tablet; Take 1 tablet (50 mg total) by mouth daily. Take with or immediately following a meal.  Dispense: 90 tablet; Refill: 0  3. At risk for heart disease Andrea Gilbert was given approximately 15 minutes of coronary artery disease prevention counseling today. She is 45 y.o. female and has risk factors for heart disease including obesity. We discussed intensive lifestyle modifications today with an emphasis on specific weight loss instructions and strategies.   Repetitive spaced learning was employed today to elicit superior memory formation and behavioral change.  4. Class 2 severe obesity with serious comorbidity and body mass index (BMI) of 35.0 to 35.9 in adult, unspecified obesity type (HCC) Andrea Gilbert is currently in the action stage of change. As such, her goal is to continue with weight loss efforts. She has agreed to keeping a food journal and adhering to recommended goals of 1350 calories and 85+ g protein.   Exercise goals: As is  Behavioral modification strategies: increasing lean protein intake, meal planning and cooking strategies, keeping healthy foods in the home and keeping a strict food journal.  Andrea Gilbert has agreed to follow-up with our clinic in 3-4 weeks.  She was informed of the importance of frequent follow-up visits to maximize her success with intensive lifestyle modifications for her multiple health conditions.   Objective:   Blood pressure (!) 151/91, pulse (!) 101, temperature 97.9 F (36.6 C), temperature source Oral, height 5\' 3"  (1.6 m), weight 198  lb (89.8 kg), last menstrual period 02/29/2020, SpO2 98 %. Body mass index is 35.07 kg/m.  General: Cooperative, alert, well developed, in no acute distress. HEENT: Conjunctivae and lids unremarkable. Cardiovascular: Regular rhythm.  Lungs: Normal work of breathing. Neurologic: No focal deficits.   Lab Results  Component Value Date   CREATININE 0.77 01/15/2020   BUN 9 01/15/2020   NA 139 01/15/2020   K 4.8 01/15/2020   CL 106 01/15/2020   CO2 25 01/15/2020   Lab Results  Component Value Date   ALT 9 01/15/2020   AST 13 01/15/2020   ALKPHOS 67 01/15/2020   BILITOT 0.3 01/15/2020   Lab Results  Component Value Date   HGBA1C 5.5 01/15/2020   HGBA1C 5.2 08/09/2019   HGBA1C 5.4 04/16/2019   Lab Results  Component Value Date   INSULIN 11.5 01/15/2020   INSULIN 13.0 08/09/2019   INSULIN 18.3 04/16/2019   Lab Results  Component Value Date   TSH 1.620 04/16/2019   Lab Results  Component Value Date   CHOL 168 01/15/2020   HDL 42 01/15/2020   LDLCALC 113 (H) 01/15/2020   TRIG 70 01/15/2020   Lab Results  Component Value Date   WBC 10.1 04/16/2019   HGB 14.9 04/16/2019   HCT 42.8 04/16/2019   MCV 88 04/16/2019   PLT 265 04/16/2019   No results found for: IRON, TIBC, FERRITIN   Attestation Statements:   Reviewed by clinician on day of visit: allergies, medications, problem list, medical history, surgical history, family history, social history, and previous encounter notes.  06/14/2019, am acting as transcriptionist for Edmund Hilda, MD.  I have reviewed the above documentation for accuracy and completeness, and I agree with the above. - Reuben Likes, MD

## 2020-06-19 ENCOUNTER — Ambulatory Visit (INDEPENDENT_AMBULATORY_CARE_PROVIDER_SITE_OTHER): Payer: 59 | Admitting: Family Medicine

## 2020-06-19 ENCOUNTER — Other Ambulatory Visit: Payer: Self-pay

## 2020-06-19 ENCOUNTER — Encounter (INDEPENDENT_AMBULATORY_CARE_PROVIDER_SITE_OTHER): Payer: Self-pay | Admitting: Family Medicine

## 2020-06-19 VITALS — BP 137/85 | HR 77 | Temp 97.6°F | Ht 63.0 in | Wt 196.0 lb

## 2020-06-19 DIAGNOSIS — Z6837 Body mass index (BMI) 37.0-37.9, adult: Secondary | ICD-10-CM

## 2020-06-19 DIAGNOSIS — I10 Essential (primary) hypertension: Secondary | ICD-10-CM

## 2020-06-19 DIAGNOSIS — Z9189 Other specified personal risk factors, not elsewhere classified: Secondary | ICD-10-CM

## 2020-06-19 DIAGNOSIS — E559 Vitamin D deficiency, unspecified: Secondary | ICD-10-CM | POA: Diagnosis not present

## 2020-06-19 MED ORDER — VITAMIN D (ERGOCALCIFEROL) 1.25 MG (50000 UNIT) PO CAPS: 50000.0000 [IU] | ORAL_CAPSULE | ORAL | 0 refills | Status: DC

## 2020-06-25 NOTE — Progress Notes (Signed)
Chief Complaint:   OBESITY Andrea Gilbert is here to discuss her progress with her obesity treatment plan along with follow-up of her obesity related diagnoses. Andrea Gilbert is on keeping a food journal and adhering to recommended goals of 1350 calories and 85 g protein and states Andrea is following her eating plan approximately 100% of the time. Andrea Gilbert states Andrea is doing cardio and circuit training 60 minutes 6 times per week.  Today's visit was #: 16 Starting weight: 212 lbs Starting date: 04/16/2019 Today's weight: 196 lbs Today's date: 06/19/2020 Total lbs lost to date: 16 Total lbs lost since last in-office visit: 2  Interim History: Andrea Gilbert is starting to feel frustrated with slowing weight loss. Andrea does notice a change in non-scale victories like decreased diameter in thighs and back and waist circumference. Andrea is easily getting in 80-85 g protein/day. Andrea has been much mote on with her journaling. Andrea denies hunger.  Subjective:   1. Vitamin D deficiency Shristi denies nausea, vomiting, and muscle weakness but notes fatigue. Her last Vit D level was 43.0.  2. Essential hypertension Demri's BP is well controlled today.  Pt denies chest pain, chest pressure and headache.  3. At risk for osteoporosis Andrea Gilbert is at higher risk of osteopenia and osteoporosis due to Vitamin D deficiency.   Assessment/Plan:   1. Vitamin D deficiency Low Vitamin D level contributes to fatigue and are associated with obesity, breast, and colon cancer. Andrea agrees to continue to take prescription Vitamin D @50 ,000 IU every week and will follow-up for routine testing of Vitamin D, at least 2-3 times per year to avoid over-replacement.  - Vitamin D, Ergocalciferol, (DRISDOL) 1.25 MG (50000 UNIT) CAPS capsule; Take 1 capsule (50,000 Units total) by mouth every 7 (seven) days.  Dispense: 12 capsule; Refill: 0  2. Essential hypertension Ralonda is working on healthy weight loss and exercise to  improve blood pressure control. We will watch for signs of hypotension as Andrea continues her lifestyle modifications. Continue current treatment plan.  3. At risk for osteoporosis Andrea Gilbert was given approximately 15 minutes of osteoporosis prevention counseling today. Andrea Gilbert is at risk for osteopenia and osteoporosis due to her Vitamin D deficiency. Andrea was encouraged to take her Vitamin D and follow her higher calcium diet and increase strengthening exercise to help strengthen her bones and decrease her risk of osteopenia and osteoporosis.  Repetitive spaced learning was employed today to elicit superior memory formation and behavioral change.  4. Class 2 severe obesity with serious comorbidity and body mass index (BMI) of 37.0 to 37.9 in adult, unspecified obesity type (HCC) Andrea Gilbert is currently in the action stage of change. As such, her goal is to continue with weight loss efforts. Andrea has agreed to keeping a food journal and adhering to recommended goals of 1200-1400 calories and 85+ grams protein.   Exercise goals: As is  Behavioral modification strategies: increasing lean protein intake, meal planning and cooking strategies and keeping a strict food journal.  Andrea Gilbert has agreed to follow-up with our clinic in 3 weeks. Andrea was informed of the importance of frequent follow-up visits to maximize her success with intensive lifestyle modifications for her multiple health conditions.   Objective:   Blood pressure 137/85, pulse 77, temperature 97.6 F (36.4 C), height 5\' 3"  (1.6 m), weight 196 lb (88.9 kg), SpO2 97 %. Body mass index is 34.72 kg/m.  General: Cooperative, alert, well developed, in no acute distress. HEENT: Conjunctivae and lids unremarkable. Cardiovascular: Regular rhythm.  Lungs: Normal work of breathing. Neurologic: No focal deficits.   Lab Results  Component Value Date   CREATININE 0.77 01/15/2020   BUN 9 01/15/2020   NA 139 01/15/2020   K 4.8 01/15/2020    CL 106 01/15/2020   CO2 25 01/15/2020   Lab Results  Component Value Date   ALT 9 01/15/2020   AST 13 01/15/2020   ALKPHOS 67 01/15/2020   BILITOT 0.3 01/15/2020   Lab Results  Component Value Date   HGBA1C 5.5 01/15/2020   HGBA1C 5.2 08/09/2019   HGBA1C 5.4 04/16/2019   Lab Results  Component Value Date   INSULIN 11.5 01/15/2020   INSULIN 13.0 08/09/2019   INSULIN 18.3 04/16/2019   Lab Results  Component Value Date   TSH 1.620 04/16/2019   Lab Results  Component Value Date   CHOL 168 01/15/2020   HDL 42 01/15/2020   LDLCALC 113 (H) 01/15/2020   TRIG 70 01/15/2020   Lab Results  Component Value Date   WBC 10.1 04/16/2019   HGB 14.9 04/16/2019   HCT 42.8 04/16/2019   MCV 88 04/16/2019   PLT 265 04/16/2019     Attestation Statements:   Reviewed by clinician on day of visit: allergies, medications, problem list, medical history, surgical history, family history, social history, and previous encounter notes.  Edmund Hilda, am acting as transcriptionist for Reuben Likes, MD.   I have reviewed the above documentation for accuracy and completeness, and I agree with the above. - Katherina Mires, MD

## 2020-06-30 ENCOUNTER — Encounter (INDEPENDENT_AMBULATORY_CARE_PROVIDER_SITE_OTHER): Payer: Self-pay | Admitting: Family Medicine

## 2020-06-30 ENCOUNTER — Ambulatory Visit (INDEPENDENT_AMBULATORY_CARE_PROVIDER_SITE_OTHER): Payer: 59 | Admitting: Family Medicine

## 2020-06-30 ENCOUNTER — Other Ambulatory Visit: Payer: Self-pay

## 2020-06-30 VITALS — BP 112/69 | HR 66 | Temp 98.1°F | Ht 63.0 in | Wt 198.0 lb

## 2020-06-30 DIAGNOSIS — R0602 Shortness of breath: Secondary | ICD-10-CM

## 2020-06-30 NOTE — Progress Notes (Signed)
Patient was here IC and vitals only. Will f/u with results on next scheduled visit

## 2020-07-02 NOTE — Progress Notes (Incomplete)
Cardiology Office Note   Date:  07/02/2020   ID:  Andrea Gilbert, Andrea Gilbert [birth year], MRN [id number]  PCP:  Blair Heys  Cardiologist:   Charlton Haws, MD     History of Present Illness:  45 y.o. history of atypical chest pain. She had calcium score of 0 04/2016 Lung nodules noted also stable by f/u CT 05/2017 She has ? Sports hernia with chronic right groin and back pain BP up 04/06/18 started on ACE/beta blocker She has been able to lose weight recently   ***   Past Medical History:  Diagnosis Date  . ADD (attention deficit disorder) 09/2009  . Anxiety   . Back pain   . Chest pain   . Constipation   . Gallbladder problem   . GERD (gastroesophageal reflux disease)   . Hypertension   . IBS (irritable bowel syndrome)   . Joint pain   . Migraine    last one 01/2017, otc med prn  . Multiple food allergies   . Palpitations   . PONV (postoperative nausea and vomiting)   . Stress reaction 2010  . Thoracic outlet syndrome     Past Surgical History:  Procedure Laterality Date  . BILATERAL SALPINGECTOMY Bilateral 03/11/2017   Procedure: BILATERAL SALPINGECTOMY;  Surgeon: Marlow Baars, MD;  Location: [facility];  Service: Gynecology;  Laterality: Bilateral;  . CESAREAN SECTION  1994, 2000   x 2  . CHOLECYSTECTOMY    . COLONOSCOPY    . ENDOMETRIAL ABLATION    . GANGLION CYST EXCISION Left    hand  . INGUINAL HERNIA REPAIR Bilateral 2019  . KNEE ARTHROSCOPY Left    X 2  . LAPAROSCOPY N/A 03/11/2017   Procedure: LAPAROSCOPY OPERATIVE, PERITONEAL BIOPSY, EXCISION LEFT OVARIAN MASS;  Surgeon: Marlow Baars, MD;  Location: [facility];  Service: Gynecology;  Laterality: N/A;  . REFRACTIVE SURGERY Left    Laser surgery Left eye  . TONSILLECTOMY    . TUBAL LIGATION    . UPPER GI ENDOSCOPY       Current Outpatient Medications  Medication Sig Dispense Refill  . COVID-19 mRNA vaccine, Pfizer, 30 MCG/0.3ML injection USE AS DIRECTED .3 mL 0  . cyclobenzaprine (FLEXERIL) 10 MG  tablet Take 10 mg by mouth 3 (three) times daily as needed for muscle spasms.    Marland Kitchen dexlansoprazole (DEXILANT) 60 MG capsule Take 60 mg by mouth daily.    Marland Kitchen EPINEPHrine 0.3 mg/0.3 mL IJ SOAJ injection Inject 0.3 mg into the muscle once.    . lisdexamfetamine (VYVANSE) 30 MG capsule Take 30 mg by mouth daily.    Marland Kitchen lisinopril (ZESTRIL) 5 MG tablet Take 1 tablet (5 mg total) by mouth daily. 90 tablet 0  . metoprolol succinate (TOPROL-XL) 50 MG 24 hr tablet Take 1 tablet (50 mg total) by mouth daily. Take with or immediately following a meal. 90 tablet 0  . omeprazole (PRILOSEC) 20 MG capsule TAKE 2 CAPSULES BY MOUTH 30 MINUTES BEFORE MORNING MEAL ONCE A DAY 30 DAY(S) 60 capsule 11  . Vitamin D, Ergocalciferol, (DRISDOL) 1.25 MG (50000 UNIT) CAPS capsule Take 1 capsule (50,000 Units total) by mouth every 7 (seven) days. 12 capsule 0   No current facility-administered medications for this visit.    Allergies:   Citric acid, Concerta [methylphenidate hcl], Tomato, Adderall [amphetamine-dextroamphetamine], Strattera [atomoxetine hcl], and Sulfa antibiotics    Social History:  The patient  reports that she has never smoked. She has never used smokeless tobacco. She reports current  alcohol use. She reports that she does not use drugs.   Family History:  The patient's family history includes Alcoholism in her father; CAD in her father; Cancer in her father; Diabetes in her father and paternal grandfather; Heart disease in her father; Hyperlipidemia in her father and mother; Hypertension in her father and mother.    ROS:  Please see the history of present illness.   Otherwise, review of systems are positive for none.   All other systems are reviewed and negative.    PHYSICAL EXAM: VS:  There were no vitals taken for this visit. , BMI There is no height or weight on file to calculate BMI. Affect appropriate Healthy:  appears stated age HEENT: normal Neck supple with no adenopathy JVP normal no bruits  no thyromegaly Lungs clear with no wheezing and good diaphragmatic motion Heart:  S1/S2 no murmur, no rub, gallop or click PMI normal Abdomen: benighn, BS positve, no tenderness, no AAA no bruit.  No HSM or HJR Distal pulses intact with no bruits No edema Neuro non-focal Skin warm and dry No muscular weakness   EKG:  04/06/18 SR rate 116 voltage LVH in limb leads  CXR:   NAD   Recent Labs: 01/15/2020: ALT 9; BUN 9; Creatinine, Ser 0.77; Potassium 4.8; Sodium 139    Lipid Panel    Component Value Date/Time   CHOL 168 01/15/2020 0824   TRIG 70 01/15/2020 0824   HDL 42 01/15/2020 0824   LDLCALC 113 (H) 01/15/2020 0824      Wt Readings from Last 3 Encounters:  06/30/20 89.8 kg  06/19/20 88.9 kg  05/27/20 89.8 kg      Other studies Reviewed: Additional studies/ records that were reviewed today include: Notes Dr Richardo Priest ECG and labs .    ASSESSMENT AND PLAN:  1. Chest Pain:  Atypical normal ETT 04/30/16 and calcium score 0 Discussed updating the latter  2. GERD:improved with weight loss continue Dexilant  3. Right Groin Pain: F/U with Dr Michaell Cowing spine films negative  4. HTN:  Better continue ACE/ beta blocker  5. DM: insulin resistance discussed low carb diet better with weight loss A1c 5.5    Current medicines are reviewed at length with the patient today.  The patient does not have concerns regarding medicines.  The following changes have been made:  no change  Labs/ tests ordered today include: None   No orders of the defined types were placed in this encounter.    Disposition:   FU with me in a year      Signed, Charlton Haws, MD  07/02/2020 8:10 AM    [facility] [address] [place], [place], [place]  [address] Phone: [phone]; Fax: [phone]

## 2020-07-07 ENCOUNTER — Ambulatory Visit (INDEPENDENT_AMBULATORY_CARE_PROVIDER_SITE_OTHER): Payer: 59 | Admitting: Family Medicine

## 2020-07-07 ENCOUNTER — Other Ambulatory Visit (HOSPITAL_COMMUNITY): Payer: Self-pay

## 2020-07-07 MED ORDER — DULCOLAX 5 MG PO TBEC: DELAYED_RELEASE_TABLET | ORAL | 0 refills | Status: DC

## 2020-07-07 MED ORDER — POLYETHYLENE GLYCOL 3350 17 GM/SCOOP PO POWD
ORAL | 0 refills | Status: DC
  Filled 2020-07-07: qty 238, 1d supply, fill #0
  Filled 2020-08-22: qty 238, 7d supply, fill #0

## 2020-07-16 ENCOUNTER — Ambulatory Visit: Payer: 59 | Admitting: Cardiovascular Disease

## 2020-07-31 ENCOUNTER — Other Ambulatory Visit (HOSPITAL_COMMUNITY): Payer: Self-pay

## 2020-08-02 ENCOUNTER — Other Ambulatory Visit (INDEPENDENT_AMBULATORY_CARE_PROVIDER_SITE_OTHER): Payer: Self-pay | Admitting: Family Medicine

## 2020-08-02 DIAGNOSIS — I10 Essential (primary) hypertension: Secondary | ICD-10-CM

## 2020-08-04 ENCOUNTER — Encounter (INDEPENDENT_AMBULATORY_CARE_PROVIDER_SITE_OTHER): Payer: Self-pay | Admitting: Family Medicine

## 2020-08-04 ENCOUNTER — Ambulatory Visit (INDEPENDENT_AMBULATORY_CARE_PROVIDER_SITE_OTHER): Payer: 59 | Admitting: Family Medicine

## 2020-08-04 NOTE — Telephone Encounter (Signed)
This is regarding a prescription request.

## 2020-08-05 ENCOUNTER — Other Ambulatory Visit (INDEPENDENT_AMBULATORY_CARE_PROVIDER_SITE_OTHER): Payer: Self-pay

## 2020-08-05 DIAGNOSIS — I10 Essential (primary) hypertension: Secondary | ICD-10-CM

## 2020-08-05 MED ORDER — METOPROLOL SUCCINATE ER 50 MG PO TB24: 50.0000 mg | ORAL_TABLET | Freq: Every day | ORAL | 0 refills | Status: DC

## 2020-08-05 MED ORDER — LISINOPRIL 5 MG PO TABS: 5.0000 mg | ORAL_TABLET | Freq: Every day | ORAL | 0 refills | Status: DC

## 2020-08-20 ENCOUNTER — Other Ambulatory Visit (INDEPENDENT_AMBULATORY_CARE_PROVIDER_SITE_OTHER): Payer: Self-pay | Admitting: Family Medicine

## 2020-08-20 DIAGNOSIS — E559 Vitamin D deficiency, unspecified: Secondary | ICD-10-CM

## 2020-08-20 NOTE — Telephone Encounter (Signed)
Dr.Ukleja 

## 2020-08-22 ENCOUNTER — Other Ambulatory Visit (HOSPITAL_COMMUNITY): Payer: Self-pay

## 2020-10-14 ENCOUNTER — Other Ambulatory Visit (INDEPENDENT_AMBULATORY_CARE_PROVIDER_SITE_OTHER): Payer: Self-pay | Admitting: Family Medicine

## 2020-10-14 DIAGNOSIS — E559 Vitamin D deficiency, unspecified: Secondary | ICD-10-CM

## 2020-10-14 DIAGNOSIS — I10 Essential (primary) hypertension: Secondary | ICD-10-CM

## 2020-10-14 NOTE — Telephone Encounter (Signed)
Request 90 day Supply-Ok to Refill?  LAST APPOINTMENT DATE: 06/30/20 NEXT APPOINTMENT DATE: 11/20/20   [facility] [address], [place] - [address] [place] [address] Phone: [phone] Fax: [phone]  [facility]  [facility] - [place], [place] - [address] [place]  [address] Phone: [phone] Fax: [phone]  Patient is requesting a refill of the following medications: Pending Prescriptions:                       Disp   Refills   Vitamin D, Ergocalciferol, (DRISDOL) 1.25 *12 cap*0       Sig: Take 1 capsule (50,000 Units total) by mouth every 7          (seven) days.   metoprolol succinate (TOPROL-XL) 50 MG 24 *30 tab*0       Sig: Take 1 tablet (50 mg total) by mouth daily. Take with          or immediately following a meal.   lisinopril (ZESTRIL) 5 MG tablet           30 tab*0       Sig: Take 1 tablet (5 mg total) by mouth daily.  Vit D last filled-06/19/20 Date last filled: Toprol and Lisinopril last filled 08/05/20 Previously prescribed by Dr Lawson Radar  Lab Results      Component                Value               Date                      HGBA1C                   5.5                 01/15/2020                HGBA1C                   5.2                 08/09/2019                HGBA1C                   5.4                 04/16/2019           Lab Results      Component                Value               Date                      LDLCALC                  113 (H)             01/15/2020                CREATININE               0.77                01/15/2020  Lab Results      Component                Value               Date                      VD25OH                   43.0                01/15/2020                VD25OH                   62.6                08/09/2019                VD25OH                   23.3 (L)            04/16/2019            BP Readings  from Last 3 Encounters: 06/30/20 : 112/69 06/19/20 : 137/85 05/27/20 : (!) 151/91

## 2020-10-16 ENCOUNTER — Encounter (INDEPENDENT_AMBULATORY_CARE_PROVIDER_SITE_OTHER): Payer: Self-pay

## 2020-10-16 MED ORDER — METOPROLOL SUCCINATE ER 50 MG PO TB24: 50.0000 mg | ORAL_TABLET | Freq: Every day | ORAL | 0 refills | Status: DC

## 2020-10-16 MED ORDER — VITAMIN D (ERGOCALCIFEROL) 1.25 MG (50000 UNIT) PO CAPS: 50000.0000 [IU] | ORAL_CAPSULE | ORAL | 0 refills | Status: DC

## 2020-10-16 MED ORDER — LISINOPRIL 5 MG PO TABS: 5.0000 mg | ORAL_TABLET | Freq: Every day | ORAL | 0 refills | Status: DC

## 2020-10-30 NOTE — Progress Notes (Signed)
Cardiology Office Note   Date:  10/30/2020   ID:  Andrea, Gilbert 09-Jun-1975, MRN [id number]  PCP:  Andrea Gilbert  Cardiologist:   Andrea Haws, MD   No chief complaint on file.     History of Present Illness: Andrea Gilbert is a 45 y.o. female who presents for f/u of atypical chest pain  I use to take care of her Dad Andrea Gilbert who had advanced CAD Calcium score done 04/30/16 was 0 Had some lung nodules that were benign And stable on f/u CT 05/26/17 Has had chronic right groin and back pain  Last seen by Andrea Andrea Gilbert suspected sports hernia She has had negative spine, GI and OB/Gyn evaluations for source of pain   04/06/18 In office BP / HR up thyroid studies normal BP up with primary as well Started on ACE Lisinopril 5 mg 04/30/19 and Toprol 50 mg   Insulin level up diagnosed with insulin resistance   [facility]   Father passed 4 years ago and niece had lymphoma   Still with some anxiety about family history of CAD  Working at [employer] at [place] last 9 years   BP ok at home but taking meds at night discussed taking beta blocker in am and ACE at lunch    Past Medical History:  Diagnosis Date   ADD (attention deficit disorder) 09/2009   Anxiety    Back pain    Chest pain    Constipation    Gallbladder problem    GERD (gastroesophageal reflux disease)    Hypertension    IBS (irritable bowel syndrome)    Joint pain    Migraine    last one 01/2017, otc med prn   Multiple food allergies    Palpitations    PONV (postoperative nausea and vomiting)    Stress reaction 2010   Thoracic outlet syndrome     Past Surgical History:  Procedure Laterality Date   BILATERAL SALPINGECTOMY Bilateral 03/11/2017   Procedure: BILATERAL SALPINGECTOMY;  Surgeon: Marlow Baars, MD;  Location: [facility];  Service: Gynecology;  Laterality: Bilateral;   CESAREAN SECTION  1994, 2000   x 2   CHOLECYSTECTOMY     COLONOSCOPY     ENDOMETRIAL  ABLATION     GANGLION CYST EXCISION Left    hand   INGUINAL HERNIA REPAIR Bilateral 2019   KNEE ARTHROSCOPY Left    X 2   LAPAROSCOPY N/A 03/11/2017   Procedure: LAPAROSCOPY OPERATIVE, PERITONEAL BIOPSY, EXCISION LEFT OVARIAN MASS;  Surgeon: Marlow Baars, MD;  Location: [facility];  Service: Gynecology;  Laterality: N/A;   REFRACTIVE SURGERY Left    Laser surgery Left eye   TONSILLECTOMY     TUBAL LIGATION     UPPER GI ENDOSCOPY       Current Outpatient Medications  Medication Sig Dispense Refill   bisacodyl (DULCOLAX) 5 MG EC tablet Take 4 tablets by mouth as directed 1 days 4 tablet 0   COVID-19 mRNA vaccine, Pfizer, 30 MCG/0.3ML injection USE AS DIRECTED .3 mL 0   cyclobenzaprine (FLEXERIL) 10 MG tablet Take 10 mg by mouth 3 (three) times daily as needed for muscle spasms.     dexlansoprazole (DEXILANT) 60 MG capsule Take 60 mg by mouth daily.     EPINEPHrine 0.3 mg/0.3 mL IJ SOAJ injection Inject 0.3 mg into the muscle once.     lisdexamfetamine (VYVANSE) 30 MG capsule Take 30 mg by mouth  daily.     lisinopril (ZESTRIL) 5 MG tablet Take 1 tablet (5 mg total) by mouth daily. 30 tablet 0   metoprolol succinate (TOPROL-XL) 50 MG 24 hr tablet Take 1 tablet (50 mg total) by mouth daily. Take with or immediately following a meal. 30 tablet 0   omeprazole (PRILOSEC) 20 MG capsule TAKE 2 CAPSULES BY MOUTH 30 MINUTES BEFORE MORNING MEAL ONCE A DAY 30 DAY(S) 60 capsule 11   polyethylene glycol powder (GLYCOLAX/MIRALAX) 17 GM/SCOOP powder Mix 1 capful in 8 ounces of liquid and drink as directed 238 g 0   Vitamin D, Ergocalciferol, (DRISDOL) 1.25 MG (50000 UNIT) CAPS capsule Take 1 capsule (50,000 Units total) by mouth every 7 (seven) days. 12 capsule 0   No current facility-administered medications for this visit.    Allergies:   Citric acid, Concerta [methylphenidate hcl], Tomato, Adderall [amphetamine-dextroamphetamine], Strattera [atomoxetine hcl], and Sulfa antibiotics    Social  History:  The patient  reports that she has never smoked. She has never used smokeless tobacco. She reports current alcohol use. She reports that she does not use drugs.   Family History:  The patient's family history includes Alcoholism in her father; CAD in her father; Cancer in her father; Diabetes in her father and paternal grandfather; Heart disease in her father; Hyperlipidemia in her father and mother; Hypertension in her father and mother.    ROS:  Please see the history of present illness.   Otherwise, review of systems are positive for none.   All other systems are reviewed and negative.    PHYSICAL EXAM: VS:  There were no vitals taken for this visit. , BMI There is no height or weight on file to calculate BMI. Affect appropriate Healthy:  appears stated age HEENT: normal Neck supple with no adenopathy JVP normal no bruits no thyromegaly Lungs clear with no wheezing and good diaphragmatic motion Heart:  S1/S2 no murmur, no rub, gallop or click PMI normal Abdomen: benighn, BS positve, no tenderness, no AAA no bruit.  No HSM or HJR Distal pulses intact with no bruits No edema Neuro non-focal Skin warm and dry No muscular weakness    EKG:  04/06/18 SR rate 116 voltage LVH in limb leads 11/05/2020 NSR rate 77 normal   CXR:   NAD   Recent Labs: 01/15/2020: ALT 9; BUN 9; Creatinine, Ser 0.77; Potassium 4.8; Sodium 139    Lipid Panel    Component Value Date/Time   CHOL 168 01/15/2020 0824   TRIG 70 01/15/2020 0824   HDL 42 01/15/2020 0824   LDLCALC 113 (H) 01/15/2020 0824      Wt Readings from Last 3 Encounters:  06/30/20 89.8 kg  06/19/20 88.9 kg  05/27/20 89.8 kg      Other studies Reviewed: Additional studies/ records that were reviewed today include: Notes Andrea Gilbert ECG and labs .    ASSESSMENT AND PLAN:  1. Chest Pain:  Non cardiac calcium score 0 observe ETT normal 04/30/16 Update score from 2018 2. GERD: continue dexilant  3. Right Groin Pain: ?  Sports hernia f/u Andrea Andrea Gilbert has already done physical Rx 4. HTN:  Improved on Toprol and Lisinopril  5. DM: insulin resistance f/u Andrea Gilbert working on weight loss and diet   Current medicines are reviewed at length with the patient today.  The patient does not have concerns regarding medicines.  The following changes have been made:  no change  Labs/ tests ordered today include: None   No orders  of the defined types were placed in this encounter.  Calcium Score   Disposition:   FU with me in a year      Signed, Andrea Haws, MD  10/30/2020 8:10 AM    [facility] [address] [place], [place], [place]  [address] Phone: [phone]; Fax: [phone]

## 2020-11-05 ENCOUNTER — Other Ambulatory Visit: Payer: Self-pay

## 2020-11-05 ENCOUNTER — Encounter: Payer: Self-pay | Admitting: Cardiovascular Disease

## 2020-11-05 ENCOUNTER — Ambulatory Visit (INDEPENDENT_AMBULATORY_CARE_PROVIDER_SITE_OTHER): Payer: 59 | Admitting: Cardiovascular Disease

## 2020-11-05 VITALS — BP 160/90 | HR 77 | Ht 63.0 in | Wt 210.0 lb

## 2020-11-05 DIAGNOSIS — Z8249 Family history of ischemic heart disease and other diseases of the circulatory system: Secondary | ICD-10-CM | POA: Diagnosis not present

## 2020-11-05 DIAGNOSIS — I10 Essential (primary) hypertension: Secondary | ICD-10-CM

## 2020-11-05 DIAGNOSIS — R0789 Other chest pain: Secondary | ICD-10-CM

## 2020-11-05 NOTE — Patient Instructions (Signed)
Medication Instructions:  Your physician recommends that you continue on your current medications as directed. Please refer to the Current Medication list given to you today.  *If you need a refill on your cardiac medications before your next appointment, please call your pharmacy*  Lab Work: If you have labs (blood work) drawn today and your tests are completely normal, you will receive your results only by: MyChart Message (if you have MyChart) OR A paper copy in the mail If you have any lab test that is abnormal or we need to change your treatment, we will call you to review the results.  Testing/Procedures: Cardiac CT scanning for calcium score (CAT scanning), is a noninvasive, special x-ray that produces cross-sectional images of the body using x-rays and a computer. CT scans help physicians diagnose and treat medical conditions. For some CT exams, a contrast material is used to enhance visibility in the area of the body being studied. CT scans provide greater clarity and reveal more details than regular x-ray exams.  Follow-Up: At [facility], you and your health needs are our priority.  As part of our continuing mission to provide you with exceptional heart care, we have created designated Provider Care Teams.  These Care Teams include your primary Cardiologist (physician) and Advanced Practice Providers (APPs -  Physician Assistants and Nurse Practitioners) who all work together to provide you with the care you need, when you need it.  We recommend signing up for the patient portal called "MyChart".  Sign up information is provided on this After Visit Summary.  MyChart is used to connect with patients for Virtual Visits (Telemedicine).  Patients are able to view lab/test results, encounter notes, upcoming appointments, etc.  Non-urgent messages can be sent to your provider as well.   To learn more about what you can do with MyChart, go to [email or web address].    Your next  appointment:   1 year(s)  The format for your next appointment:   In Person  Provider:   You may see Dr. Eden Emms or one of the following Advanced Practice Providers on your designated Care Team:   Nada Boozer, NP

## 2020-11-05 NOTE — Telephone Encounter (Signed)
She is now scheduled to see Dr. Dalbert Garnet 8/30 at 12:20.

## 2020-11-06 NOTE — Telephone Encounter (Signed)
Because pt rescheduled appt, she should now have enough medication to last until her next appt.

## 2020-11-07 ENCOUNTER — Other Ambulatory Visit (INDEPENDENT_AMBULATORY_CARE_PROVIDER_SITE_OTHER): Payer: Self-pay | Admitting: Family Medicine

## 2020-11-07 DIAGNOSIS — I10 Essential (primary) hypertension: Secondary | ICD-10-CM

## 2020-11-11 ENCOUNTER — Encounter (INDEPENDENT_AMBULATORY_CARE_PROVIDER_SITE_OTHER): Payer: Self-pay | Admitting: Family Medicine

## 2020-11-11 ENCOUNTER — Other Ambulatory Visit: Payer: Self-pay

## 2020-11-11 ENCOUNTER — Ambulatory Visit (INDEPENDENT_AMBULATORY_CARE_PROVIDER_SITE_OTHER): Payer: 59 | Admitting: Family Medicine

## 2020-11-11 VITALS — BP 148/82 | HR 85 | Temp 97.6°F | Ht 63.0 in | Wt 203.0 lb

## 2020-11-11 DIAGNOSIS — E66812 Obesity, class 2: Secondary | ICD-10-CM

## 2020-11-11 DIAGNOSIS — R7303 Prediabetes: Secondary | ICD-10-CM

## 2020-11-11 DIAGNOSIS — Z6837 Body mass index (BMI) 37.0-37.9, adult: Secondary | ICD-10-CM

## 2020-11-11 DIAGNOSIS — Z9189 Other specified personal risk factors, not elsewhere classified: Secondary | ICD-10-CM

## 2020-11-11 DIAGNOSIS — I10 Essential (primary) hypertension: Secondary | ICD-10-CM | POA: Diagnosis not present

## 2020-11-11 DIAGNOSIS — E559 Vitamin D deficiency, unspecified: Secondary | ICD-10-CM

## 2020-11-12 MED ORDER — VITAMIN D (ERGOCALCIFEROL) 1.25 MG (50000 UNIT) PO CAPS: 50000.0000 [IU] | ORAL_CAPSULE | ORAL | 0 refills | Status: DC

## 2020-11-12 MED ORDER — LISINOPRIL 5 MG PO TABS: 5.0000 mg | ORAL_TABLET | Freq: Every day | ORAL | 0 refills | Status: DC

## 2020-11-12 MED ORDER — METFORMIN HCL 500 MG PO TABS: 500.0000 mg | ORAL_TABLET | Freq: Every day | ORAL | 0 refills | Status: DC

## 2020-11-12 MED ORDER — METOPROLOL SUCCINATE ER 50 MG PO TB24: 50.0000 mg | ORAL_TABLET | Freq: Every day | ORAL | 0 refills | Status: DC

## 2020-11-12 NOTE — Progress Notes (Signed)
Chief Complaint:   OBESITY Andrea Gilbert is here to discuss her progress with her obesity treatment plan along with follow-up of her obesity related diagnoses. Andrea Gilbert is on keeping a food journal and adhering to recommended goals of 1200-1400 calories and 85+ grams of protein daily and states she is following her eating plan approximately 50% of the time. Andrea Gilbert states she is walking for 30 minutes 2-3 times per week.  Today's visit was #: 18 Starting weight: 212 lbs Starting date: 04/16/2019 Today's weight: 203 lbs Today's date: 11/11/2020 Total lbs lost to date: 9 Total lbs lost since last in-office visit: 0  Interim History: Andrea Gilbert last visit was approximately 4 months ago. She has been off track since then but she has been mindful and trying to portion control. She is ready to get back to journaling.  Subjective:   1. Essential hypertension Andrea Gilbert's blood pressure tends to be elevated in our office, but within normal limits when she checks it on her own. She is due for labs.  2. Pre-diabetes Andrea Gilbert had a recent fasting glucose >100 with an elevated fasting insulin, but a normal A1c. She has been working on weight loss, and she is due for labs.  3. Vitamin D deficiency Andrea Gilbert is stable on Vit D, and she is due for labs.  4. At risk for diabetes mellitus Andrea Gilbert is at higher than average risk for developing diabetes due to obesity.   Assessment/Plan:   1. Essential hypertension Andrea Gilbert will continue working on healthy weight loss and exercise to improve blood pressure control. We will refill lisinopril 5 mg and metoprolol 50 mg for 1 month. She will watch for signs of hypotension as she continues her lifestyle modifications.  2. Pre-diabetes Andrea Gilbert agreed to start metformin 500 mg q AM #30 with no refills. She will continue to work on weight loss, exercise, and decreasing simple carbohydrates to help decrease the risk of diabetes.   3. Vitamin D  deficiency Low Vitamin D level contributes to fatigue and are associated with obesity, breast, and colon cancer. We will refill prescription Vitamin D 50,000 IU every week for 90 days with no refills. Andrea Gilbert will follow-up for routine testing of Vitamin D, at least 2-3 times per year to avoid over-replacement.  4. At risk for diabetes mellitus Andrea Gilbert was given approximately 30 minutes of diabetes education and counseling today. We discussed intensive lifestyle modifications today with an emphasis on weight loss as well as increasing exercise and decreasing simple carbohydrates in her diet. We also reviewed medication options with an emphasis on risk versus benefit of those discussed.   Repetitive spaced learning was employed today to elicit superior memory formation and behavioral change.  5. Obesity with current BMI of 36.1 Andrea Gilbert is currently in the action stage of change. As such, her goal is to continue with weight loss efforts. She has agreed to keeping a food journal and adhering to recommended goals of 1200-1400 calories and 85+ grams of protein daily.   We will recheck fasting labs at her next visit.  Exercise goals: As is.  Behavioral modification strategies: increasing lean protein intake and keeping a strict food journal.  Andrea Gilbert has agreed to follow-up with our clinic in 3 to 4 weeks. She was informed of the importance of frequent follow-up visits to maximize her success with intensive lifestyle modifications for her multiple health conditions.   Objective:   Blood pressure (!) 148/82, pulse 85, temperature 97.6 F (36.4 C), height 5\' 3"  (1.6 m),  weight 203 lb (92.1 kg), SpO2 98 %. Body mass index is 35.96 kg/m.  General: Cooperative, alert, well developed, in no acute distress. HEENT: Conjunctivae and lids unremarkable. Cardiovascular: Regular rhythm.  Lungs: Normal work of breathing. Neurologic: No focal deficits.   Lab Results  Component Value Date    CREATININE 0.77 01/15/2020   BUN 9 01/15/2020   NA 139 01/15/2020   K 4.8 01/15/2020   CL 106 01/15/2020   CO2 25 01/15/2020   Lab Results  Component Value Date   ALT 9 01/15/2020   AST 13 01/15/2020   ALKPHOS 67 01/15/2020   BILITOT 0.3 01/15/2020   Lab Results  Component Value Date   HGBA1C 5.5 01/15/2020   HGBA1C 5.2 08/09/2019   HGBA1C 5.4 04/16/2019   Lab Results  Component Value Date   INSULIN 11.5 01/15/2020   INSULIN 13.0 08/09/2019   INSULIN 18.3 04/16/2019   Lab Results  Component Value Date   TSH 1.620 04/16/2019   Lab Results  Component Value Date   CHOL 168 01/15/2020   HDL 42 01/15/2020   LDLCALC 113 (H) 01/15/2020   TRIG 70 01/15/2020   Lab Results  Component Value Date   VD25OH 43.0 01/15/2020   VD25OH 62.6 08/09/2019   VD25OH 23.3 (L) 04/16/2019   Lab Results  Component Value Date   WBC 10.1 04/16/2019   HGB 14.9 04/16/2019   HCT 42.8 04/16/2019   MCV 88 04/16/2019   PLT 265 04/16/2019   No results found for: IRON, TIBC, FERRITIN  Attestation Statements:   Reviewed by clinician on day of visit: allergies, medications, problem list, medical history, surgical history, family history, social history, and previous encounter notes.   I, Burt Knack, am acting as transcriptionist for Quillian Quince, MD.  I have reviewed the above documentation for accuracy and completeness, and I agree with the above. -  Quillian Quince, MD

## 2020-11-20 ENCOUNTER — Ambulatory Visit (INDEPENDENT_AMBULATORY_CARE_PROVIDER_SITE_OTHER): Payer: 59 | Admitting: Family Medicine

## 2020-11-25 ENCOUNTER — Other Ambulatory Visit: Payer: Self-pay

## 2020-11-25 ENCOUNTER — Ambulatory Visit (INDEPENDENT_AMBULATORY_CARE_PROVIDER_SITE_OTHER)
Admission: RE | Admit: 2020-11-25 | Discharge: 2020-11-25 | Disposition: A | Discharge status: Home / Self Care | Payer: Self-pay | Source: Ambulatory Visit | Attending: Cardiovascular Disease | Admitting: Cardiovascular Disease

## 2020-11-25 DIAGNOSIS — I10 Essential (primary) hypertension: Secondary | ICD-10-CM

## 2020-12-10 ENCOUNTER — Encounter (INDEPENDENT_AMBULATORY_CARE_PROVIDER_SITE_OTHER): Payer: Self-pay

## 2020-12-11 ENCOUNTER — Ambulatory Visit (INDEPENDENT_AMBULATORY_CARE_PROVIDER_SITE_OTHER): Payer: 59 | Admitting: Family Medicine

## 2020-12-11 ENCOUNTER — Encounter (INDEPENDENT_AMBULATORY_CARE_PROVIDER_SITE_OTHER): Payer: Self-pay | Admitting: Family Medicine

## 2020-12-11 ENCOUNTER — Other Ambulatory Visit: Payer: Self-pay

## 2020-12-11 VITALS — BP 158/96 | HR 92 | Temp 97.9°F | Ht 63.0 in | Wt 198.0 lb

## 2020-12-11 DIAGNOSIS — E8881 Metabolic syndrome: Secondary | ICD-10-CM | POA: Diagnosis not present

## 2020-12-11 DIAGNOSIS — I10 Essential (primary) hypertension: Secondary | ICD-10-CM

## 2020-12-11 DIAGNOSIS — E559 Vitamin D deficiency, unspecified: Secondary | ICD-10-CM | POA: Diagnosis not present

## 2020-12-11 DIAGNOSIS — E7849 Other hyperlipidemia: Secondary | ICD-10-CM | POA: Diagnosis not present

## 2020-12-11 DIAGNOSIS — Z9189 Other specified personal risk factors, not elsewhere classified: Secondary | ICD-10-CM | POA: Diagnosis not present

## 2020-12-11 DIAGNOSIS — Z6837 Body mass index (BMI) 37.0-37.9, adult: Secondary | ICD-10-CM

## 2020-12-11 MED ORDER — METOPROLOL SUCCINATE ER 50 MG PO TB24: 50.0000 mg | ORAL_TABLET | Freq: Every day | ORAL | 0 refills | Status: DC

## 2020-12-11 MED ORDER — LISINOPRIL 5 MG PO TABS: 5.0000 mg | ORAL_TABLET | Freq: Every day | ORAL | 0 refills | Status: DC

## 2020-12-11 NOTE — Progress Notes (Signed)
Chief Complaint:   OBESITY Andrea Gilbert is here to discuss her progress with her obesity treatment plan along with follow-up of her obesity related diagnoses. Andrea Gilbert is on keeping a food journal and adhering to recommended goals of 1200-1400 calories and 85+ grams protein and states she is following her eating plan approximately 75% of the time. Andrea Gilbert states she is not currently exercising.  Today's visit was #: 19 Starting weight: 212 lbs Starting date: 04/15/2020 Today's weight: 198 lbs Today's date: 12/11/2020 Total lbs lost to date: 14 Total lbs lost since last in-office visit: 5  Interim History: Andrea Gilbert was last seen 4 weeks ago. The last 2 weeks, she didn't eat on plan due to colonoscopy planned for tomorrow. She plans to get back on plan after her colonoscopy. Prior to the last 2 weeks, she was 75% on plan. Pt wants to restart circuit training when getting all nutrition in.  Subjective:   1. Essential hypertension BP elevated today. Pt denies chest pain/chest pressure/headache.   2. Other hyperlipidemia Andrea Gilbert's last LDL was 113, HDL 42, and triglycerides 70. She is not on meds.  3. Insulin resistance Pt's last A1c was 5.5 and insulin level 11.5. She is on Metformin.  4. Vitamin D deficiency Andrea Gilbert reports fatigue and is not on prescription Vit D.  5. At risk for heart disease Andrea Gilbert is at a higher than average risk for cardiovascular disease due to obesity.   Assessment/Plan:   1. Essential hypertension Andrea Gilbert is working on healthy weight loss and exercise to improve blood pressure control. We will watch for signs of hypotension as she continues her lifestyle modifications. Check labs today.  - Comprehensive metabolic panel  Refill- lisinopril (ZESTRIL) 5 MG tablet; Take 1 tablet (5 mg total) by mouth daily.  Dispense: 90 tablet; Refill: 0  Refill- metoprolol succinate (TOPROL-XL) 50 MG 24 hr tablet; Take 1 tablet (50 mg total) by mouth daily.  Take with or immediately following a meal.  Dispense: 90 tablet; Refill: 0  2. Other hyperlipidemia Cardiovascular risk and specific lipid/LDL goals reviewed.  We discussed several lifestyle modifications today and Andrea Gilbert will continue to work on diet, exercise and weight loss efforts. Orders and follow up as documented in patient record.   Counseling Intensive lifestyle modifications are the first line treatment for this issue. Dietary changes: Increase soluble fiber. Decrease simple carbohydrates. Exercise changes: Moderate to vigorous-intensity aerobic activity 150 minutes per week if tolerated. Lipid-lowering medications: see documented in medical record. Check labs today.  - Lipid Panel With LDL/HDL Ratio  3. Insulin resistance Andrea Gilbert will continue to work on weight loss, exercise, and decreasing simple carbohydrates to help decrease the risk of diabetes. Andrea Gilbert agreed to follow-up with [place] as directed to closely monitor her progress. Check labs today.  - Hemoglobin A1c - Insulin, random  4. Vitamin D deficiency Low Vitamin D level contributes to fatigue and are associated with obesity, breast, and colon cancer. She will follow-up for routine testing of Vitamin D, at least 2-3 times per year to avoid over-replacement. Check labs today.  - VITAMIN D 25 Hydroxy (Vit-D Deficiency, Fractures)  5. At risk for heart disease Andrea Gilbert was given approximately 15 minutes of coronary artery disease prevention counseling today. She is 45 y.o. female and has risk factors for heart disease including obesity. We discussed intensive lifestyle modifications today with an emphasis on specific weight loss instructions and strategies.   Repetitive spaced learning was employed today to elicit superior memory formation and behavioral change.  6. Obesity with current BMI of 35.1  [name] is currently in the action stage of change. As such, her goal is to continue with weight loss efforts.  She has agreed to keeping a food journal and adhering to recommended goals of 1200-1400 calories and 85+ grams protein.   Exercise goals: All adults should avoid inactivity. Some physical activity is better than none, and adults who participate in any amount of physical activity gain some health benefits.  Behavioral modification strategies: increasing lean protein intake, meal planning and cooking strategies, and keeping healthy foods in the home.  Andrea Gilbert has agreed to follow-up with our clinic in 3 weeks. She was informed of the importance of frequent follow-up visits to maximize her success with intensive lifestyle modifications for her multiple health conditions.   Andrea Gilbert was informed we would discuss her lab results at her next visit unless there is a critical issue that needs to be addressed sooner. Andrea Gilbert agreed to keep her next visit at the agreed upon time to discuss these results.  Objective:   Blood pressure (!) 158/96, pulse 92, temperature 97.9 F (36.6 C), height 5\' 3"  (1.6 m), weight 198 lb (89.8 kg), SpO2 99 %. Body mass index is 35.07 kg/m.  General: Cooperative, alert, well developed, in no acute distress. HEENT: Conjunctivae and lids unremarkable. Cardiovascular: Regular rhythm.  Lungs: Normal work of breathing. Neurologic: No focal deficits.   Lab Results  Component Value Date   CREATININE 0.77 01/15/2020   BUN 9 01/15/2020   NA 139 01/15/2020   K 4.8 01/15/2020   CL 106 01/15/2020   CO2 25 01/15/2020   Lab Results  Component Value Date   ALT 9 01/15/2020   AST 13 01/15/2020   ALKPHOS 67 01/15/2020   BILITOT 0.3 01/15/2020   Lab Results  Component Value Date   HGBA1C 5.5 01/15/2020   HGBA1C 5.2 08/09/2019   HGBA1C 5.4 04/16/2019   Lab Results  Component Value Date   INSULIN 11.5 01/15/2020   INSULIN 13.0 08/09/2019   INSULIN 18.3 04/16/2019   Lab Results  Component Value Date   TSH 1.620 04/16/2019   Lab Results  Component Value  Date   CHOL 168 01/15/2020   HDL 42 01/15/2020   LDLCALC 113 (H) 01/15/2020   TRIG 70 01/15/2020   Lab Results  Component Value Date   VD25OH 43.0 01/15/2020   VD25OH 62.6 08/09/2019   VD25OH 23.3 (L) 04/16/2019   Lab Results  Component Value Date   WBC 10.1 04/16/2019   HGB 14.9 04/16/2019   HCT 42.8 04/16/2019   MCV 88 04/16/2019   PLT 265 04/16/2019    Attestation Statements:   Reviewed by clinician on day of visit: allergies, medications, problem list, medical history, surgical history, family history, social history, and previous encounter notes.  06/14/2019, CMA, am acting as transcriptionist for Edmund Hilda, MD.   I have reviewed the above documentation for accuracy and completeness, and I agree with the above. - Reuben Likes, MD

## 2020-12-12 LAB — COMPREHENSIVE METABOLIC PANEL
ALT: 16 IU/L (ref 0–32)
AST: 17 IU/L (ref 0–40)
Albumin/Globulin Ratio: 1.5 (ref 1.2–2.2)
Albumin: 4.5 g/dL (ref 3.8–4.8)
Alkaline Phosphatase: 78 IU/L (ref 44–121)
BUN/Creatinine Ratio: 10 (ref 9–23)
BUN: 8 mg/dL (ref 6–24)
Bilirubin Total: 0.4 mg/dL (ref 0.0–1.2)
CO2: 22 mmol/L (ref 20–29)
Calcium: 9.5 mg/dL (ref 8.7–10.2)
Chloride: 106 mmol/L (ref 96–106)
Creatinine, Ser: 0.8 mg/dL (ref 0.57–1.00)
Globulin, Total: 3 g/dL (ref 1.5–4.5)
Glucose: 111 mg/dL — ABNORMAL HIGH (ref 70–99)
Potassium: 4.4 mmol/L (ref 3.5–5.2)
Sodium: 142 mmol/L (ref 134–144)
Total Protein: 7.5 g/dL (ref 6.0–8.5)
eGFR: 92 mL/min/{1.73_m2} (ref >59)

## 2020-12-12 LAB — INSULIN, RANDOM: INSULIN: 15.9 u[IU]/mL (ref 2.6–24.9)

## 2020-12-12 LAB — LIPID PANEL WITH LDL/HDL RATIO
Cholesterol, Total: 205 mg/dL — ABNORMAL HIGH (ref 100–199)
HDL: 41 mg/dL (ref >39)
LDL Chol Calc (NIH): 134 mg/dL — ABNORMAL HIGH (ref 0–99)
LDL/HDL Ratio: 3.3 ratio — ABNORMAL HIGH (ref 0.0–3.2)
Triglycerides: 168 mg/dL — ABNORMAL HIGH (ref 0–149)
VLDL Cholesterol Cal: 30 mg/dL (ref 5–40)

## 2020-12-12 LAB — VITAMIN D 25 HYDROXY (VIT D DEFICIENCY, FRACTURES): Vit D, 25-Hydroxy: 51.7 ng/mL (ref 30.0–100.0)

## 2020-12-12 LAB — HEMOGLOBIN A1C
Est. average glucose Bld gHb Est-mCnc: 111 mg/dL
Hgb A1c MFr Bld: 5.5 % (ref 4.8–5.6)

## 2020-12-17 ENCOUNTER — Encounter (INDEPENDENT_AMBULATORY_CARE_PROVIDER_SITE_OTHER): Payer: Self-pay | Admitting: Family Medicine

## 2020-12-17 NOTE — Telephone Encounter (Signed)
Please advise 

## 2020-12-17 NOTE — Telephone Encounter (Signed)
Dr.Ukleja 

## 2020-12-18 ENCOUNTER — Other Ambulatory Visit (HOSPITAL_COMMUNITY): Payer: Self-pay

## 2020-12-18 MED ORDER — CEPHALEXIN 500 MG PO CAPS
500.0000 mg | ORAL_CAPSULE | Freq: Two times a day (BID) | ORAL | 0 refills | Status: AC
  Filled 2020-12-18: qty 10, 5d supply, fill #0

## 2020-12-31 ENCOUNTER — Other Ambulatory Visit (INDEPENDENT_AMBULATORY_CARE_PROVIDER_SITE_OTHER): Payer: Self-pay | Admitting: Family Medicine

## 2020-12-31 DIAGNOSIS — R7303 Prediabetes: Secondary | ICD-10-CM

## 2020-12-31 NOTE — Telephone Encounter (Signed)
Please advise 

## 2021-01-01 ENCOUNTER — Other Ambulatory Visit (HOSPITAL_COMMUNITY): Payer: Self-pay

## 2021-01-01 ENCOUNTER — Other Ambulatory Visit (INDEPENDENT_AMBULATORY_CARE_PROVIDER_SITE_OTHER): Payer: Self-pay | Admitting: Bariatrics

## 2021-01-01 DIAGNOSIS — R7303 Prediabetes: Secondary | ICD-10-CM

## 2021-01-01 MED ORDER — METFORMIN HCL 500 MG PO TABS
500.0000 mg | ORAL_TABLET | Freq: Every day | ORAL | 0 refills | Status: DC
Start: 2021-01-01 — End: 2021-01-19
  Filled 2021-01-01 (×2): qty 30, 30d supply, fill #0

## 2021-01-01 NOTE — Telephone Encounter (Signed)
LAST APPOINTMENT DATE: 12/11/20 NEXT APPOINTMENT DATE: 01/19/21   [facility] [address], [place] - [address] [place] [address] Phone: [phone] Fax: [phone]  [facility]  [facility]) - [place], [place] - [address] [facility] [address] [place] [address] [place] [address] Phone: [phone] Fax: [phone]  [id number] - [place], [place] - [address] [facility] [address] [place] [address] Phone: [phone] Fax: [phone]  Patient is requesting a refill of the following medications: Pending Prescriptions:                       Disp   Refills   metFORMIN (GLUCOPHAGE) 500 MG tablet       30 tab*0       Sig: Take 1 tablet (500 mg total) by mouth daily with          breakfast.   Date last filled: 11/12/20 Previously prescribed by Dr.Ukleja  Lab Results      Component                Value               Date                      HGBA1C                   5.5                 12/11/2020                HGBA1C                   5.5                 01/15/2020                HGBA1C                   5.2                 08/09/2019           Lab Results      Component                Value               Date                      LDLCALC                  134 (H)             12/11/2020                CREATININE               0.80                12/11/2020           Lab Results      Component                Value               Date                      VD25OH  51.7                12/11/2020                VD25OH                   43.0                01/15/2020                VD25OH                   62.6                08/09/2019            BP Readings from Last 3 Encounters: 12/11/20 : (!) 158/96 11/11/20 : (!) 148/82 11/05/20 : (!) 160/90

## 2021-01-01 NOTE — Telephone Encounter (Signed)
Pt last seen by Dr. Ukleja.  

## 2021-01-19 ENCOUNTER — Other Ambulatory Visit: Payer: Self-pay

## 2021-01-19 ENCOUNTER — Encounter (INDEPENDENT_AMBULATORY_CARE_PROVIDER_SITE_OTHER): Payer: Self-pay | Admitting: Family Medicine

## 2021-01-19 ENCOUNTER — Ambulatory Visit (INDEPENDENT_AMBULATORY_CARE_PROVIDER_SITE_OTHER): Payer: 59 | Admitting: Family Medicine

## 2021-01-19 VITALS — BP 170/98 | HR 93 | Temp 98.0°F | Ht 63.0 in | Wt 201.0 lb

## 2021-01-19 DIAGNOSIS — R7303 Prediabetes: Secondary | ICD-10-CM | POA: Diagnosis not present

## 2021-01-19 DIAGNOSIS — I10 Essential (primary) hypertension: Secondary | ICD-10-CM | POA: Diagnosis not present

## 2021-01-19 DIAGNOSIS — E559 Vitamin D deficiency, unspecified: Secondary | ICD-10-CM | POA: Diagnosis not present

## 2021-01-19 DIAGNOSIS — Z6837 Body mass index (BMI) 37.0-37.9, adult: Secondary | ICD-10-CM

## 2021-01-19 MED ORDER — METFORMIN HCL 500 MG PO TABS: 500.0000 mg | ORAL_TABLET | Freq: Every day | ORAL | 0 refills | Status: DC

## 2021-01-19 MED ORDER — VITAMIN D (ERGOCALCIFEROL) 1.25 MG (50000 UNIT) PO CAPS: 50000.0000 [IU] | ORAL_CAPSULE | ORAL | 0 refills | Status: DC

## 2021-01-20 ENCOUNTER — Other Ambulatory Visit: Payer: Self-pay | Admitting: Family Medicine

## 2021-01-20 DIAGNOSIS — Z1231 Encounter for screening mammogram for malignant neoplasm of breast: Secondary | ICD-10-CM

## 2021-01-20 NOTE — Progress Notes (Signed)
Chief Complaint:   OBESITY Andrea Gilbert is here to discuss her progress with her obesity treatment plan along with follow-up of her obesity related diagnoses. Andrea Gilbert is on keeping a food journal and adhering to recommended goals of 1200-1400 calories and 85+ grams protein and states she is following her eating plan approximately 50% of the time. Andrea Gilbert states she is not currently exercising.  Today's visit was #: 20 Starting weight: 212 lbs Starting date: 04/16/2019 Today's weight: 201 lbs Today's date: 01/19/2021 Total lbs lost to date: 11 Total lbs lost since last in-office visit: 0  Interim History: Andrea Gilbert recognizes that she has had a difficult time getting all food in and is wondering if her Vyvanse is playing a part in it. She is leaving for a cruise in 2 weeks. Pt is wondering what she can plan in order to adhere closer to the plan. She is also planning a trip to [place] as a surprise for her husband's birthday.  Subjective:   1. Vitamin D deficiency Pt denies nausea, vomiting, and muscle weakness but notes fatigue. Pt would rather stay on prescription Vit D.  2. Pre-diabetes Andrea Gilbert's last insulin level was minimally higher. Her A1c was 5.5 and insulin level 15.9.  3. Essential hypertension BP elevated today. Pt denies chest pain/chest pressure/headache.  Assessment/Plan:   1. Vitamin D deficiency Low Vitamin D level contributes to fatigue and are associated with obesity, breast, and colon cancer. She agrees to continue to take prescription Vitamin D 50,000 IU every week and will follow-up for routine testing of Vitamin D, at least 2-3 times per year to avoid over-replacement.  Refill- Vitamin D, Ergocalciferol, (DRISDOL) 1.25 MG (50000 UNIT) CAPS capsule; Take 1 capsule (50,000 Units total) by mouth every 7 (seven) days.  Dispense: 12 capsule; Refill: 0  2. Pre-diabetes Andrea Gilbert will continue to work on weight loss, exercise, and decreasing simple carbohydrates  to help decrease the risk of diabetes.   Refill- metFORMIN (GLUCOPHAGE) 500 MG tablet; Take 1 tablet (500 mg total) by mouth daily with breakfast.  Dispense: 90 tablet; Refill: 0  3. Essential hypertension Andrea Gilbert is working on healthy weight loss and exercise to improve blood pressure control. We will watch for signs of hypotension as she continues her lifestyle modifications. Follow up on BP at next appt. Pt is to check and monitor BP at home.  4. Obesity with current BMI of 35.7  Andrea Gilbert is currently in the action stage of change. As such, her goal is to continue with weight loss efforts. She has agreed to keeping a food journal and adhering to recommended goals of 1200-1400 calories and 85+ grams protein.   Exercise goals: All adults should avoid inactivity. Some physical activity is better than none, and adults who participate in any amount of physical activity gain some health benefits.  Behavioral modification strategies: increasing lean protein intake, no skipping meals, keeping healthy foods in the home, and ways to avoid boredom eating.  Andrea Gilbert has agreed to follow-up with our clinic in 3 weeks. She was informed of the importance of frequent follow-up visits to maximize her success with intensive lifestyle modifications for her multiple health conditions.   Objective:   Blood pressure (!) 170/98, pulse 93, temperature 98 F (36.7 C), height 5\' 3"  (1.6 m), weight 201 lb (91.2 kg), SpO2 98 %. Body mass index is 35.61 kg/m.  General: Cooperative, alert, well developed, in no acute distress. HEENT: Conjunctivae and lids unremarkable. Cardiovascular: Regular rhythm.  Lungs: Normal work of  breathing. Neurologic: No focal deficits.   Lab Results  Component Value Date   CREATININE 0.80 12/11/2020   BUN 8 12/11/2020   NA 142 12/11/2020   K 4.4 12/11/2020   CL 106 12/11/2020   CO2 22 12/11/2020   Lab Results  Component Value Date   ALT 16 12/11/2020   AST 17  12/11/2020   ALKPHOS 78 12/11/2020   BILITOT 0.4 12/11/2020   Lab Results  Component Value Date   HGBA1C 5.5 12/11/2020   HGBA1C 5.5 01/15/2020   HGBA1C 5.2 08/09/2019   HGBA1C 5.4 04/16/2019   Lab Results  Component Value Date   INSULIN 15.9 12/11/2020   INSULIN 11.5 01/15/2020   INSULIN 13.0 08/09/2019   INSULIN 18.3 04/16/2019   Lab Results  Component Value Date   TSH 1.620 04/16/2019   Lab Results  Component Value Date   CHOL 205 (H) 12/11/2020   HDL 41 12/11/2020   LDLCALC 134 (H) 12/11/2020   TRIG 168 (H) 12/11/2020   Lab Results  Component Value Date   VD25OH 51.7 12/11/2020   VD25OH 43.0 01/15/2020   VD25OH 62.6 08/09/2019   Lab Results  Component Value Date   WBC 10.1 04/16/2019   HGB 14.9 04/16/2019   HCT 42.8 04/16/2019   MCV 88 04/16/2019   PLT 265 04/16/2019    Attestation Statements:   Reviewed by clinician on day of visit: allergies, medications, problem list, medical history, surgical history, family history, social history, and previous encounter notes.  Edmund Hilda, CMA, am acting as transcriptionist for Reuben Likes, MD.   I have reviewed the above documentation for accuracy and completeness, and I agree with the above. - Reuben Likes, MD

## 2021-02-09 ENCOUNTER — Other Ambulatory Visit: Payer: Self-pay

## 2021-02-09 ENCOUNTER — Ambulatory Visit (INDEPENDENT_AMBULATORY_CARE_PROVIDER_SITE_OTHER): Payer: 59 | Admitting: Family Medicine

## 2021-02-09 ENCOUNTER — Encounter (INDEPENDENT_AMBULATORY_CARE_PROVIDER_SITE_OTHER): Payer: Self-pay | Admitting: Family Medicine

## 2021-02-09 VITALS — BP 143/90 | HR 83 | Temp 98.1°F | Ht 63.0 in | Wt 203.0 lb

## 2021-02-09 DIAGNOSIS — Z6837 Body mass index (BMI) 37.0-37.9, adult: Secondary | ICD-10-CM

## 2021-02-09 DIAGNOSIS — E7849 Other hyperlipidemia: Secondary | ICD-10-CM

## 2021-02-09 DIAGNOSIS — I10 Essential (primary) hypertension: Secondary | ICD-10-CM | POA: Diagnosis not present

## 2021-02-09 MED ORDER — LISINOPRIL 5 MG PO TABS: 5.0000 mg | ORAL_TABLET | Freq: Every day | ORAL | 0 refills | Status: DC

## 2021-02-09 MED ORDER — METOPROLOL SUCCINATE ER 50 MG PO TB24: 50.0000 mg | ORAL_TABLET | Freq: Every day | ORAL | 0 refills | Status: DC

## 2021-02-09 NOTE — Progress Notes (Signed)
Chief Complaint:   OBESITY Andrea Gilbert is here to discuss her progress with her obesity treatment plan along with follow-up of her obesity related diagnoses. Andrea Gilbert is on keeping a food journal and adhering to recommended goals of 1200-1400 calories and 85 grams protein and states she is following her eating plan approximately 50% of the time. Andrea Gilbert states she is doing 10,000 steps 5 times per Gilbert.  Today's visit was #: 21 Starting weight: 212 lbs Starting date: 04/16/2019 Today's weight: 203 lbs Today's date: 02/09/2021 Total lbs lost to date: 9 Total lbs lost since last in-office visit: 0  Interim History: Andrea Gilbert had a family filled Thanksgiving and is going to [place]. Since her last appt, she has been very mindful of food intake. She is still having mostly meat and vegetables. Pt has been doing more physical activity since her last appt. She will be mostly local for the holidays.  Subjective:   1. Essential hypertension BP slightly elevated today but at and home, BP is within normal limits.  2. Other hyperlipidemia Andrea Gilbert's last LDL was 134, HDL 41, and triglycerides 426. She is not on statin.  Assessment/Plan:   1. Essential hypertension Andrea Gilbert is working on healthy weight loss and exercise to improve blood pressure control. We will watch for signs of hypotension as she continues her lifestyle modifications.  Refill- lisinopril (ZESTRIL) 5 MG tablet; Take 1 tablet (5 mg total) by mouth daily.  Dispense: 90 tablet; Refill: 0 Refill- metoprolol succinate (TOPROL-XL) 50 MG 24 hr tablet; Take 1 tablet (50 mg total) by mouth daily. Take with or immediately following a meal.  Dispense: 90 tablet; Refill: 0  2. Other hyperlipidemia Cardiovascular risk and specific lipid/LDL goals reviewed.  We discussed several lifestyle modifications today and Andrea Gilbert will continue to work on diet, exercise and weight loss efforts. Orders and follow up as documented in  patient record. Repeat labs in February 2023.  Counseling Intensive lifestyle modifications are the first line treatment for this issue. Dietary changes: Increase soluble fiber. Decrease simple carbohydrates. Exercise changes: Moderate to vigorous-intensity aerobic activity 150 minutes per Gilbert if tolerated. Lipid-lowering medications: see documented in medical record.  3. Obesity with current BMI of 36.0  Andrea Gilbert is currently in the action stage of change. As such, her goal is to continue with weight loss efforts. She has agreed to keeping a food journal and adhering to recommended goals of 1200-1400 calories and 85+ grams protein.   Exercise goals:  As is  Behavioral modification strategies: increasing lean protein intake, meal planning and cooking strategies, travel eating strategies, and keeping a strict food journal.  Andrea Gilbert has agreed to follow-up with our clinic in 4 weeks. She was informed of the importance of frequent follow-up visits to maximize her success with intensive lifestyle modifications for her multiple health conditions.   Objective:   Blood pressure (!) 143/90, pulse 83, temperature 98.1 F (36.7 C), height 5\' 3"  (1.6 m), weight 203 lb (92.1 kg), SpO2 98 %. Body mass index is 35.96 kg/m.  General: Cooperative, alert, well developed, in no acute distress. HEENT: Conjunctivae and lids unremarkable. Cardiovascular: Regular rhythm.  Lungs: Normal work of breathing. Neurologic: No focal deficits.   Lab Results  Component Value Date   CREATININE 0.80 12/11/2020   BUN 8 12/11/2020   NA 142 12/11/2020   K 4.4 12/11/2020   CL 106 12/11/2020   CO2 22 12/11/2020   Lab Results  Component Value Date   ALT 16 12/11/2020  AST 17 12/11/2020   ALKPHOS 78 12/11/2020   BILITOT 0.4 12/11/2020   Lab Results  Component Value Date   HGBA1C 5.5 12/11/2020   HGBA1C 5.5 01/15/2020   HGBA1C 5.2 08/09/2019   HGBA1C 5.4 04/16/2019   Lab Results  Component Value  Date   INSULIN 15.9 12/11/2020   INSULIN 11.5 01/15/2020   INSULIN 13.0 08/09/2019   INSULIN 18.3 04/16/2019   Lab Results  Component Value Date   TSH 1.620 04/16/2019   Lab Results  Component Value Date   CHOL 205 (H) 12/11/2020   HDL 41 12/11/2020   LDLCALC 134 (H) 12/11/2020   TRIG 168 (H) 12/11/2020   Lab Results  Component Value Date   VD25OH 51.7 12/11/2020   VD25OH 43.0 01/15/2020   VD25OH 62.6 08/09/2019   Lab Results  Component Value Date   WBC 10.1 04/16/2019   HGB 14.9 04/16/2019   HCT 42.8 04/16/2019   MCV 88 04/16/2019   PLT 265 04/16/2019    Attestation Statements:   Reviewed by clinician on day of visit: allergies, medications, problem list, medical history, surgical history, family history, social history, and previous encounter notes.  Edmund Hilda, CMA, am acting as transcriptionist for Reuben Likes, MD.   I have reviewed the above documentation for accuracy and completeness, and I agree with the above. - Reuben Likes, MD

## 2021-02-23 ENCOUNTER — Ambulatory Visit
Admission: RE | Admit: 2021-02-23 | Discharge: 2021-02-23 | Disposition: A | Discharge status: Home / Self Care | Payer: 59 | Source: Ambulatory Visit

## 2021-02-23 DIAGNOSIS — Z1231 Encounter for screening mammogram for malignant neoplasm of breast: Secondary | ICD-10-CM

## 2021-03-02 ENCOUNTER — Ambulatory Visit (INDEPENDENT_AMBULATORY_CARE_PROVIDER_SITE_OTHER): Payer: 59 | Admitting: Family Medicine

## 2021-03-22 ENCOUNTER — Other Ambulatory Visit (INDEPENDENT_AMBULATORY_CARE_PROVIDER_SITE_OTHER): Payer: Self-pay | Admitting: Family Medicine

## 2021-03-22 DIAGNOSIS — E559 Vitamin D deficiency, unspecified: Secondary | ICD-10-CM

## 2021-03-23 ENCOUNTER — Ambulatory Visit (INDEPENDENT_AMBULATORY_CARE_PROVIDER_SITE_OTHER): Payer: 59 | Admitting: Family Medicine

## 2021-04-01 ENCOUNTER — Other Ambulatory Visit: Payer: Self-pay | Admitting: Family Medicine

## 2021-04-01 ENCOUNTER — Other Ambulatory Visit (INDEPENDENT_AMBULATORY_CARE_PROVIDER_SITE_OTHER): Payer: Self-pay | Admitting: Family Medicine

## 2021-04-01 ENCOUNTER — Ambulatory Visit
Admission: RE | Admit: 2021-04-01 | Discharge: 2021-04-01 | Disposition: A | Discharge status: Home / Self Care | Payer: 59 | Source: Ambulatory Visit | Attending: Family Medicine | Admitting: Family Medicine

## 2021-04-01 ENCOUNTER — Encounter (INDEPENDENT_AMBULATORY_CARE_PROVIDER_SITE_OTHER): Payer: Self-pay

## 2021-04-01 DIAGNOSIS — R109 Unspecified abdominal pain: Secondary | ICD-10-CM

## 2021-04-01 DIAGNOSIS — R7303 Prediabetes: Secondary | ICD-10-CM

## 2021-04-01 MED ORDER — IOPAMIDOL (ISOVUE-300) INJECTION 61%
100.0000 mL | Freq: Once | INTRAVENOUS | Status: AC | PRN
  Administered 2021-04-01: 100 mL via INTRAVENOUS

## 2021-04-01 NOTE — Telephone Encounter (Signed)
Message sent to pt-CAS 

## 2021-04-01 NOTE — Telephone Encounter (Signed)
Dr.Ukleja 

## 2021-04-02 ENCOUNTER — Inpatient Hospital Stay: Admission: RE | Admit: 2021-04-02 | Payer: 59 | Source: Ambulatory Visit

## 2021-04-25 ENCOUNTER — Other Ambulatory Visit (INDEPENDENT_AMBULATORY_CARE_PROVIDER_SITE_OTHER): Payer: Self-pay | Admitting: Family Medicine

## 2021-04-25 DIAGNOSIS — I10 Essential (primary) hypertension: Secondary | ICD-10-CM

## 2021-05-04 ENCOUNTER — Encounter (INDEPENDENT_AMBULATORY_CARE_PROVIDER_SITE_OTHER): Payer: Self-pay | Admitting: Family Medicine

## 2021-05-04 ENCOUNTER — Other Ambulatory Visit: Payer: Self-pay

## 2021-05-04 ENCOUNTER — Ambulatory Visit (INDEPENDENT_AMBULATORY_CARE_PROVIDER_SITE_OTHER): Payer: 59 | Admitting: Family Medicine

## 2021-05-04 VITALS — BP 170/96 | HR 87 | Temp 98.0°F | Ht 63.0 in | Wt 198.0 lb

## 2021-05-04 DIAGNOSIS — Z9189 Other specified personal risk factors, not elsewhere classified: Secondary | ICD-10-CM

## 2021-05-04 DIAGNOSIS — E8881 Metabolic syndrome: Secondary | ICD-10-CM

## 2021-05-04 DIAGNOSIS — E669 Obesity, unspecified: Secondary | ICD-10-CM

## 2021-05-04 DIAGNOSIS — Z6837 Body mass index (BMI) 37.0-37.9, adult: Secondary | ICD-10-CM

## 2021-05-04 DIAGNOSIS — E7849 Other hyperlipidemia: Secondary | ICD-10-CM | POA: Diagnosis not present

## 2021-05-04 DIAGNOSIS — I10 Essential (primary) hypertension: Secondary | ICD-10-CM | POA: Diagnosis not present

## 2021-05-04 DIAGNOSIS — E559 Vitamin D deficiency, unspecified: Secondary | ICD-10-CM | POA: Diagnosis not present

## 2021-05-04 DIAGNOSIS — Z6835 Body mass index (BMI) 35.0-35.9, adult: Secondary | ICD-10-CM

## 2021-05-04 MED ORDER — METOPROLOL SUCCINATE ER 50 MG PO TB24: 50.0000 mg | ORAL_TABLET | Freq: Every day | ORAL | 0 refills | Status: DC

## 2021-05-04 MED ORDER — LISINOPRIL 5 MG PO TABS: 5.0000 mg | ORAL_TABLET | Freq: Every day | ORAL | 0 refills | Status: DC

## 2021-05-04 NOTE — Progress Notes (Signed)
Chief Complaint:   OBESITY Andrea Gilbert is here to discuss her progress with her obesity treatment plan along with follow-up of her obesity related diagnoses. Andrea Gilbert is on keeping a food journal and adhering to recommended goals of 1200-1400 calories and 85+ grams protein and states she is following her eating plan approximately 75% of the time. Andrea Gilbert states she is circuit training 30 minutes 3 times per week.  Today's visit was #: 22 Starting weight: 212 lbs Starting date: 04/16/2019 Today's weight: 198 lbs Today's date: 05/04/2021 Total lbs lost to date: 14 Total lbs lost since last in-office visit: 5  Interim History: Pt had a great holiday (last appt 02/09/21). She is trying to be mindful of food choices and ranging between 1300-1500 calories per day. Pt recognizes that she likely isn't getting as much protein in. She is wondering about GLP-1 or increasing Metformin.  Subjective:   1. Essential hypertension BP very elevated today. Andrea Gilbert denies chest pain/chest pressure/headache.  2. Vitamin D deficiency Andrea Gilbert denies nausea, vomiting, and muscle weakness but notes fatigue. Her last Vit D level was 51.7.  3. Insulin resistance Andrea Gilbert's last HgA1c was 5.5 with an insulin level of 15.9.  4. Other hyperlipidemia Andrea Gilbert's last LDL was 134, HDL 41, and triglycerides 387.  5. At risk for heart disease Andrea Gilbert is at higher than average risk for cardiovascular disease due to obesity.  Assessment/Plan:   1. Essential hypertension Andrea Gilbert is working on healthy weight loss and exercise to improve blood pressure control. We will watch for signs of hypotension as she continues her lifestyle modifications. Check labs today.  Refill lisinopril 5 mg PO daily, Disp #90, 0 RF Refill metoprolol 50 mg PO daily, Disp #90, 0 RF  - Comprehensive metabolic panel  2. Vitamin D deficiency Low Vitamin D level contributes to fatigue and are associated with obesity, breast,  and colon cancer. She will follow-up for routine testing of Vitamin D, at least 2-3 times per year to avoid over-replacement. Check labs today.  - VITAMIN D 25 Hydroxy (Vit-D Deficiency, Fractures)  3. Insulin resistance Andrea Gilbert will continue to work on weight loss, exercise, and decreasing simple carbohydrates to help decrease the risk of diabetes. Andrea Gilbert agreed to follow-up with [place] as directed to closely monitor her progress. Check labs today.  - Hemoglobin A1c - Insulin, random  4. Other hyperlipidemia Cardiovascular risk and specific lipid/LDL goals reviewed.  We discussed several lifestyle modifications today and Andrea Gilbert will continue to work on diet, exercise and weight loss efforts. Orders and follow up as documented in patient record.   Counseling Intensive lifestyle modifications are the first line treatment for this issue. Dietary changes: Increase soluble fiber. Decrease simple carbohydrates. Exercise changes: Moderate to vigorous-intensity aerobic activity 150 minutes per week if tolerated. Lipid-lowering medications: see documented in medical record. Check labs today.  - Lipid Panel With LDL/HDL Ratio  5. At risk for heart disease Andrea Gilbert was given approximately 15 minutes of coronary artery disease prevention counseling today. She is 46 y.o. female and has risk factors for heart disease including obesity. We discussed intensive lifestyle modifications today with an emphasis on specific weight loss instructions and strategies.  Repetitive spaced learning was employed today to elicit superior memory formation and behavioral change.    6. Obesity with current BMI of 35.2 Andrea Gilbert is currently in the action stage of change. As such, her goal is to continue with weight loss efforts. She has agreed to keeping a food journal and adhering to recommended  goals of 1300-1400 calories and 85+ grams protein.   Exercise goals:  As is- Continue 3-4 times a week of circuit  training.  Behavioral modification strategies: increasing lean protein intake, meal planning and cooking strategies, keeping healthy foods in the home, and planning for success.  Andrea Gilbert has agreed to follow-up with our clinic in 4 weeks. She was informed of the importance of frequent follow-up visits to maximize her success with intensive lifestyle modifications for her multiple health conditions.   Andrea Gilbert was informed we would discuss her lab results at her next visit unless there is a critical issue that needs to be addressed sooner. Andrea Gilbert agreed to keep her next visit at the agreed upon time to discuss these results.  Objective:   Blood pressure (!) 170/96, pulse 87, temperature 98 F (36.7 C), height 5\' 3"  (1.6 m), weight 198 lb (89.8 kg), SpO2 99 %. Body mass index is 35.07 kg/m.  General: Cooperative, alert, well developed, in no acute distress. HEENT: Conjunctivae and lids unremarkable. Cardiovascular: Regular rhythm.  Lungs: Normal work of breathing. Neurologic: No focal deficits.   Lab Results  Component Value Date   CREATININE 0.80 12/11/2020   BUN 8 12/11/2020   NA 142 12/11/2020   K 4.4 12/11/2020   CL 106 12/11/2020   CO2 22 12/11/2020   Lab Results  Component Value Date   ALT 16 12/11/2020   AST 17 12/11/2020   ALKPHOS 78 12/11/2020   BILITOT 0.4 12/11/2020   Lab Results  Component Value Date   HGBA1C 5.5 12/11/2020   HGBA1C 5.5 01/15/2020   HGBA1C 5.2 08/09/2019   HGBA1C 5.4 04/16/2019   Lab Results  Component Value Date   INSULIN 15.9 12/11/2020   INSULIN 11.5 01/15/2020   INSULIN 13.0 08/09/2019   INSULIN 18.3 04/16/2019   Lab Results  Component Value Date   TSH 1.620 04/16/2019   Lab Results  Component Value Date   CHOL 205 (H) 12/11/2020   HDL 41 12/11/2020   LDLCALC 134 (H) 12/11/2020   TRIG 168 (H) 12/11/2020   Lab Results  Component Value Date   VD25OH 51.7 12/11/2020   VD25OH 43.0 01/15/2020   VD25OH 62.6 08/09/2019    Lab Results  Component Value Date   WBC 10.1 04/16/2019   HGB 14.9 04/16/2019   HCT 42.8 04/16/2019   MCV 88 04/16/2019   PLT 265 04/16/2019    Attestation Statements:   Reviewed by clinician on day of visit: allergies, medications, problem list, medical history, surgical history, family history, social history, and previous encounter notes.  06/14/2019, CMA, am acting as transcriptionist for Edmund Hilda, MD.   I have reviewed the above documentation for accuracy and completeness, and I agree with the above. - Reuben Likes, MD

## 2021-05-08 LAB — LIPID PANEL WITH LDL/HDL RATIO
Cholesterol, Total: 194 mg/dL (ref 100–199)
HDL: 43 mg/dL (ref >39)
LDL Chol Calc (NIH): 127 mg/dL — ABNORMAL HIGH (ref 0–99)
LDL/HDL Ratio: 3 ratio (ref 0.0–3.2)
Triglycerides: 135 mg/dL (ref 0–149)
VLDL Cholesterol Cal: 24 mg/dL (ref 5–40)

## 2021-05-08 LAB — COMPREHENSIVE METABOLIC PANEL
ALT: 14 IU/L (ref 0–32)
AST: 15 IU/L (ref 0–40)
Albumin/Globulin Ratio: 1.6 (ref 1.2–2.2)
Albumin: 4.3 g/dL (ref 3.8–4.8)
Alkaline Phosphatase: 69 IU/L (ref 44–121)
BUN/Creatinine Ratio: 15 (ref 9–23)
BUN: 10 mg/dL (ref 6–24)
Bilirubin Total: 0.3 mg/dL (ref 0.0–1.2)
CO2: 23 mmol/L (ref 20–29)
Calcium: 8.8 mg/dL (ref 8.7–10.2)
Chloride: 103 mmol/L (ref 96–106)
Creatinine, Ser: 0.66 mg/dL (ref 0.57–1.00)
Globulin, Total: 2.7 g/dL (ref 1.5–4.5)
Glucose: 106 mg/dL — ABNORMAL HIGH (ref 70–99)
Potassium: 4 mmol/L (ref 3.5–5.2)
Sodium: 139 mmol/L (ref 134–144)
Total Protein: 7 g/dL (ref 6.0–8.5)
eGFR: 109 mL/min/{1.73_m2} (ref >59)

## 2021-05-08 LAB — VITAMIN D 25 HYDROXY (VIT D DEFICIENCY, FRACTURES): Vit D, 25-Hydroxy: 55.6 ng/mL (ref 30.0–100.0)

## 2021-05-08 LAB — INSULIN, RANDOM: INSULIN: 12 u[IU]/mL (ref 2.6–24.9)

## 2021-05-08 LAB — HEMOGLOBIN A1C
Est. average glucose Bld gHb Est-mCnc: 105 mg/dL
Hgb A1c MFr Bld: 5.3 % (ref 4.8–5.6)

## 2021-06-02 ENCOUNTER — Ambulatory Visit (INDEPENDENT_AMBULATORY_CARE_PROVIDER_SITE_OTHER): Payer: 59 | Admitting: Family Medicine

## 2021-06-02 ENCOUNTER — Other Ambulatory Visit: Payer: Self-pay

## 2021-06-02 ENCOUNTER — Encounter (INDEPENDENT_AMBULATORY_CARE_PROVIDER_SITE_OTHER): Payer: Self-pay | Admitting: Family Medicine

## 2021-06-02 VITALS — BP 144/84 | HR 78 | Temp 98.3°F | Ht 63.0 in | Wt 200.0 lb

## 2021-06-02 DIAGNOSIS — I10 Essential (primary) hypertension: Secondary | ICD-10-CM

## 2021-06-02 DIAGNOSIS — E669 Obesity, unspecified: Secondary | ICD-10-CM | POA: Diagnosis not present

## 2021-06-02 DIAGNOSIS — E559 Vitamin D deficiency, unspecified: Secondary | ICD-10-CM | POA: Diagnosis not present

## 2021-06-02 DIAGNOSIS — Z6835 Body mass index (BMI) 35.0-35.9, adult: Secondary | ICD-10-CM

## 2021-06-02 MED ORDER — METOPROLOL SUCCINATE ER 50 MG PO TB24: 50.0000 mg | ORAL_TABLET | Freq: Every day | ORAL | 0 refills | Status: DC

## 2021-06-02 MED ORDER — LISINOPRIL 5 MG PO TABS: 5.0000 mg | ORAL_TABLET | Freq: Every day | ORAL | 0 refills | Status: DC

## 2021-06-02 MED ORDER — WEGOVY 0.25 MG/0.5ML ~~LOC~~ SOAJ: 0.2500 mg | SUBCUTANEOUS | 0 refills | Status: DC

## 2021-06-03 ENCOUNTER — Encounter (INDEPENDENT_AMBULATORY_CARE_PROVIDER_SITE_OTHER): Payer: Self-pay | Admitting: Family Medicine

## 2021-06-04 NOTE — Progress Notes (Signed)
? ? ? ?Chief Complaint:  ? ?OBESITY ?Andrea Gilbert is here to discuss her progress with her obesity treatment plan along with follow-up of her obesity related diagnoses. Clydell is on keeping a food journal and adhering to recommended goals of 1300-1400 calories and 85 plus grams of protein and states she is following her eating plan approximately 25% of the time. Maiko states she is doing 0 minutes 0 times per week. ? ?Today's visit was #: 23 ?Starting weight: 212 lbs ?Starting date: 04/16/2019 ?Today's weight: 200 lbs ?Today's date: 06/02/2021 ?Total lbs lost to date: 12 lbs ?Total lbs lost since last in-office visit: 0 ? ?Interim History: Andrea Gilbert has been eating out more and dealing with more familial stress. She recognizes she needs to get back to cooking her own foods. [place]. She wants some down time.  ? ?Subjective:  ? ?1. Vitamin D deficiency ?Andrea Gilbert is on prescription Vitamin D with slight increase to 55.6. She denies nausea, vomiting and muscle weakness. She notes fatigue.  ? ?2. Essential hypertension ?Andrea Gilbert's blood pressure is slightly elevated today at 144/84. She hasn't received metoprolol and lisinopril.  ? ?Assessment/Plan:  ? ?1. Vitamin D deficiency ?Low Vitamin D level contributes to fatigue and are associated with obesity, breast, and colon cancer. We will refill prescription Vitamin D 50,000 IU every week for 1 month with no refills and Carrine will follow-up for routine testing of Vitamin D, at least 2-3 times per year to avoid over-replacement. ? ?2. Essential hypertension ?Suheily will follow up with [facility] as to status of prescription. We will refill lisinopril 5 mg daily for 3 months with no refills. We will refill metoprolol 50 mg daily for 3 months with no refills. She is working on healthy weight loss and exercise to improve blood pressure control. We will watch for signs of hypotension as she continues her lifestyle modifications. ? ?-  metoprolol succinate (TOPROL-XL) 50 MG 24 hr tablet; Take 1 tablet (50 mg total) by mouth daily. Take with or immediately following a meal.  Dispense: 90 tablet; Refill: 0 ?- lisinopril (ZESTRIL) 5 MG tablet; Take 1 tablet (5 mg total) by mouth daily.  Dispense: 90 tablet; Refill: 0 ? ?3. Obesity with current BMI of 35.4 ?Andrea Gilbert is currently in the action stage of change. As such, her goal is to continue with weight loss efforts. She has agreed to keeping a food journal and adhering to recommended goals of 1300-1400 calories and 85 plus grams of protein.  ? ?Andrea Gilbert agrees to start Wegovy 0.25 mg subcutaneous weekly with no refills.  ? ?- Semaglutide-Weight Management (WEGOVY) 0.25 MG/0.5ML SOAJ; Inject 0.25 mg into the skin once a week.  Dispense: 2 mL; Refill: 0 ? ?Exercise goals: No exercise has been prescribed at this time. ? ?Behavioral modification strategies: increasing lean protein intake, meal planning and cooking strategies, keeping healthy foods in the home, and keeping a strict food journal. ? ?Andrea Gilbert has agreed to follow-up with our clinic in 3 weeks. She was informed of the importance of frequent follow-up visits to maximize her success with intensive lifestyle modifications for her multiple health conditions.  ? ?Objective:  ? ?Blood pressure (!) 144/84, pulse 78, temperature 98.3 ?F (36.8 ?C), height 5\' 3"  (1.6 m), weight 200 lb (90.7 kg), SpO2 98 %. ?Body mass index is 35.43 kg/m?. ? ?General: Cooperative, alert, well developed, in no acute distress. ?HEENT: Conjunctivae and lids unremarkable. ?Cardiovascular: Regular rhythm.  ?Lungs: Normal work of breathing. ?Neurologic: No  focal deficits.  ? ?Lab Results  ?Component Value Date  ? CREATININE 0.66 05/07/2021  ? BUN 10 05/07/2021  ? NA 139 05/07/2021  ? K 4.0 05/07/2021  ? CL 103 05/07/2021  ? CO2 23 05/07/2021  ? ?Lab Results  ?Component Value Date  ? ALT 14 05/07/2021  ? AST 15 05/07/2021  ? ALKPHOS 69 05/07/2021  ? BILITOT 0.3 05/07/2021   ? ?Lab Results  ?Component Value Date  ? HGBA1C 5.3 05/07/2021  ? HGBA1C 5.5 12/11/2020  ? HGBA1C 5.5 01/15/2020  ? HGBA1C 5.2 08/09/2019  ? HGBA1C 5.4 04/16/2019  ? ?Lab Results  ?Component Value Date  ? INSULIN 12.0 05/07/2021  ? INSULIN 15.9 12/11/2020  ? INSULIN 11.5 01/15/2020  ? INSULIN 13.0 08/09/2019  ? INSULIN 18.3 04/16/2019  ? ?Lab Results  ?Component Value Date  ? TSH 1.620 04/16/2019  ? ?Lab Results  ?Component Value Date  ? CHOL 194 05/07/2021  ? HDL 43 05/07/2021  ? LDLCALC 127 (H) 05/07/2021  ? TRIG 135 05/07/2021  ? ?Lab Results  ?Component Value Date  ? VD25OH 55.6 05/07/2021  ? VD25OH 51.7 12/11/2020  ? VD25OH 43.0 01/15/2020  ? ?Lab Results  ?Component Value Date  ? WBC 10.1 04/16/2019  ? HGB 14.9 04/16/2019  ? HCT 42.8 04/16/2019  ? MCV 88 04/16/2019  ? PLT 265 04/16/2019  ? ?No results found for: IRON, TIBC, FERRITIN ? ?Attestation Statements:  ? ?Reviewed by clinician on day of visit: allergies, medications, problem list, medical history, surgical history, family history, social history, and previous encounter notes. ? ?I, Jackson Latino, RMA, am acting as transcriptionist for Reuben Likes, MD. ?I have reviewed the above documentation for accuracy and completeness, and I agree with the above. Reuben Likes, MD ? ?

## 2021-06-04 NOTE — Telephone Encounter (Signed)
Please advise 

## 2021-06-08 ENCOUNTER — Other Ambulatory Visit (INDEPENDENT_AMBULATORY_CARE_PROVIDER_SITE_OTHER): Payer: Self-pay | Admitting: Family Medicine

## 2021-06-08 DIAGNOSIS — R7303 Prediabetes: Secondary | ICD-10-CM

## 2021-06-08 MED ORDER — OZEMPIC (0.25 OR 0.5 MG/DOSE) 2 MG/1.5ML ~~LOC~~ SOPN: 0.2500 mg | PEN_INJECTOR | SUBCUTANEOUS | 0 refills | Status: DC

## 2021-06-08 NOTE — Telephone Encounter (Signed)
Please advise 

## 2021-06-10 ENCOUNTER — Telehealth (INDEPENDENT_AMBULATORY_CARE_PROVIDER_SITE_OTHER): Payer: Self-pay | Admitting: Family Medicine

## 2021-06-10 ENCOUNTER — Encounter (INDEPENDENT_AMBULATORY_CARE_PROVIDER_SITE_OTHER): Payer: Self-pay

## 2021-06-10 NOTE — Telephone Encounter (Signed)
Prior authorization denied for Ozempic. Per insurance: patient does not have type 2 diabetes. Patient sent denial message via mychart.  ?

## 2021-06-10 NOTE — Telephone Encounter (Signed)
Please advise 

## 2021-06-24 ENCOUNTER — Encounter (INDEPENDENT_AMBULATORY_CARE_PROVIDER_SITE_OTHER): Payer: Self-pay | Admitting: Family Medicine

## 2021-06-24 ENCOUNTER — Ambulatory Visit (INDEPENDENT_AMBULATORY_CARE_PROVIDER_SITE_OTHER): Payer: 59 | Admitting: Family Medicine

## 2021-06-24 ENCOUNTER — Other Ambulatory Visit: Payer: Self-pay

## 2021-06-24 VITALS — BP 158/94 | HR 88 | Temp 97.9°F | Ht 63.0 in | Wt 202.0 lb

## 2021-06-24 DIAGNOSIS — E66812 Obesity, class 2: Secondary | ICD-10-CM

## 2021-06-24 DIAGNOSIS — E669 Obesity, unspecified: Secondary | ICD-10-CM

## 2021-06-24 DIAGNOSIS — E88819 Insulin resistance, unspecified: Secondary | ICD-10-CM

## 2021-06-24 DIAGNOSIS — Z6835 Body mass index (BMI) 35.0-35.9, adult: Secondary | ICD-10-CM

## 2021-06-24 DIAGNOSIS — E8881 Metabolic syndrome: Secondary | ICD-10-CM

## 2021-06-24 DIAGNOSIS — Z9189 Other specified personal risk factors, not elsewhere classified: Secondary | ICD-10-CM

## 2021-06-24 DIAGNOSIS — E559 Vitamin D deficiency, unspecified: Secondary | ICD-10-CM

## 2021-06-24 MED ORDER — CEPHALEXIN 500 MG PO CAPS
ORAL_CAPSULE | ORAL | 0 refills | Status: DC
  Filled 2021-06-24: qty 9, 3d supply, fill #0

## 2021-06-24 MED ORDER — VITAMIN D (ERGOCALCIFEROL) 1.25 MG (50000 UNIT) PO CAPS: 50000.0000 [IU] | ORAL_CAPSULE | ORAL | 0 refills | Status: DC

## 2021-06-24 MED ORDER — METFORMIN HCL 500 MG PO TABS: 500.0000 mg | ORAL_TABLET | Freq: Two times a day (BID) | ORAL | 0 refills | Status: DC

## 2021-06-24 MED ORDER — VYVANSE 20 MG PO CAPS
ORAL_CAPSULE | ORAL | 0 refills | Status: DC
  Filled 2021-06-24: qty 30, 30d supply, fill #0

## 2021-06-25 ENCOUNTER — Other Ambulatory Visit: Payer: Self-pay

## 2021-06-26 NOTE — Progress Notes (Signed)
? ? ? ?Chief Complaint:  ? ?OBESITY ?Mischelle is here to discuss her progress with her obesity treatment plan along with follow-up of her obesity related diagnoses. Saiya is on keeping a food journal and adhering to recommended goals of 1300-1400 calories and 85+ grams of protein daily and states she is following her eating plan approximately 0% of the time. Kenesha states she is doing 0 minutes 0 times per week. ? ?Today's visit was #: 24 ?Starting weight: 212 lbs ?Starting date: 04/16/2019 ?Today's weight: 202 lbs ?Today's date: 06/24/2021 ?Total lbs lost to date: 10 ?Total lbs lost since last in-office visit: 0 ? ?Interim History: Alejah has been sick with upper respiratory infection type symptoms since her last appointment. She didn't eat much for a few days then did overindulge with eating out. Ozempic was not approved. She wants to start journaling and wants to start walking again.  ? ?Subjective:  ? ?1. Insulin resistance ?Elana's last A1c was 5.3 and insulin 12.0. Her insurance denied Ozempic. She denies GI side effects.  ? ?2. Vitamin D deficiency ?Marisal is on prescription Vitamin D. Last Vitamin D level was of 55.6. ? ?3. At risk for side effect of medication ?Livvy is at risk for drug side effects due to starting metformin and its risk for GI upset. ? ?Assessment/Plan:  ? ?1. Insulin resistance ?Henslee agreed to increase metformin 500 mg PO BID, and we will refill for 90 days with no refill.  ? ?- metFORMIN (GLUCOPHAGE) 500 MG tablet; Take 1 tablet (500 mg total) by mouth 2 (two) times daily with a meal.  Dispense: 180 tablet; Refill: 0 ? ?2. Vitamin D deficiency ?Tenasia agreed to change Vitamin D 50,000 IU every 14 days, and we will refill for 90 days, with no refills.  ? ?- Vitamin D, Ergocalciferol, (DRISDOL) 1.25 MG (50000 UNIT) CAPS capsule; Take 1 capsule (50,000 Units total) by mouth every 14 (fourteen) days.  Dispense: 6 capsule; Refill: 0 ? ?3. At risk for side effect of  medication ?Raymonda was given approximately 15 minutes of drug side effect counseling today.  We discussed side effect possibility and risk versus benefits. Devon agreed to the medication and will contact this office if these side effects are intolerable. ? ?Repetitive spaced learning was employed today to elicit superior memory formation and behavioral change. ? ?4. Obesity with current BMI of 35.9 ?Itsel is currently in the action stage of change. As such, her goal is to continue with weight loss efforts. She has agreed to keeping a food journal and adhering to recommended goals of 1300-1400 calories and 85+ grams of protein daily.  ? ?Exercise goals: All adults should avoid inactivity. Some physical activity is better than none, and adults who participate in any amount of physical activity gain some health benefits. Margarit is to start walking 3 times per week.  ? ?Behavioral modification strategies: increasing lean protein intake, meal planning and cooking strategies, keeping healthy foods in the home, and planning for success. ? ?Netra has agreed to follow-up with our clinic in 4 weeks. She was informed of the importance of frequent follow-up visits to maximize her success with intensive lifestyle modifications for her multiple health conditions.  ? ?Objective:  ? ?Blood pressure (!) 158/94, pulse 88, temperature 97.9 ?F (36.6 ?C), height 5\' 3"  (1.6 m), weight 202 lb (91.6 kg), SpO2 98 %. ?Body mass index is 35.78 kg/m?. ? ?General: Cooperative, alert, well developed, in no acute distress. ?HEENT: Conjunctivae and lids unremarkable. ?Cardiovascular: Regular rhythm.  ?  Lungs: Normal work of breathing. ?Neurologic: No focal deficits.  ? ?Lab Results  ?Component Value Date  ? CREATININE 0.66 05/07/2021  ? BUN 10 05/07/2021  ? NA 139 05/07/2021  ? K 4.0 05/07/2021  ? CL 103 05/07/2021  ? CO2 23 05/07/2021  ? ?Lab Results  ?Component Value Date  ? ALT 14 05/07/2021  ? AST 15 05/07/2021  ? ALKPHOS 69  05/07/2021  ? BILITOT 0.3 05/07/2021  ? ?Lab Results  ?Component Value Date  ? HGBA1C 5.3 05/07/2021  ? HGBA1C 5.5 12/11/2020  ? HGBA1C 5.5 01/15/2020  ? HGBA1C 5.2 08/09/2019  ? HGBA1C 5.4 04/16/2019  ? ?Lab Results  ?Component Value Date  ? INSULIN 12.0 05/07/2021  ? INSULIN 15.9 12/11/2020  ? INSULIN 11.5 01/15/2020  ? INSULIN 13.0 08/09/2019  ? INSULIN 18.3 04/16/2019  ? ?Lab Results  ?Component Value Date  ? TSH 1.620 04/16/2019  ? ?Lab Results  ?Component Value Date  ? CHOL 194 05/07/2021  ? HDL 43 05/07/2021  ? LDLCALC 127 (H) 05/07/2021  ? TRIG 135 05/07/2021  ? ?Lab Results  ?Component Value Date  ? VD25OH 55.6 05/07/2021  ? VD25OH 51.7 12/11/2020  ? VD25OH 43.0 01/15/2020  ? ?Lab Results  ?Component Value Date  ? WBC 10.1 04/16/2019  ? HGB 14.9 04/16/2019  ? HCT 42.8 04/16/2019  ? MCV 88 04/16/2019  ? PLT 265 04/16/2019  ? ?No results found for: IRON, TIBC, FERRITIN ? ?Attestation Statements:  ? ?Reviewed by clinician on day of visit: allergies, medications, problem list, medical history, surgical history, family history, social history, and previous encounter notes. ? ? ?I, Burt Knack, am acting as transcriptionist for Reuben Likes, MD. ? ?I have reviewed the above documentation for accuracy and completeness, and I agree with the above. Reuben Likes, MD ? ? ?

## 2021-07-22 ENCOUNTER — Ambulatory Visit (INDEPENDENT_AMBULATORY_CARE_PROVIDER_SITE_OTHER): Payer: 59 | Admitting: Family Medicine

## 2021-07-24 ENCOUNTER — Other Ambulatory Visit: Payer: Self-pay

## 2021-07-28 ENCOUNTER — Other Ambulatory Visit: Payer: Self-pay

## 2021-07-28 MED ORDER — NITROFURANTOIN MACROCRYSTAL 50 MG PO CAPS
ORAL_CAPSULE | ORAL | 2 refills | Status: AC
Start: 2021-07-28 — End: ?
  Filled 2021-07-28: qty 30, 30d supply, fill #0
  Filled 2022-02-11 – 2022-02-19 (×2): qty 30, 30d supply, fill #1

## 2021-07-28 MED ORDER — VYVANSE 20 MG PO CAPS
ORAL_CAPSULE | ORAL | 0 refills | Status: DC
  Filled 2021-07-28: qty 30, 30d supply, fill #0

## 2021-07-31 ENCOUNTER — Other Ambulatory Visit: Payer: Self-pay | Admitting: Sports Medicine

## 2021-07-31 DIAGNOSIS — M5441 Lumbago with sciatica, right side: Secondary | ICD-10-CM

## 2021-08-08 ENCOUNTER — Other Ambulatory Visit (INDEPENDENT_AMBULATORY_CARE_PROVIDER_SITE_OTHER): Payer: Self-pay | Admitting: Family Medicine

## 2021-08-08 DIAGNOSIS — I10 Essential (primary) hypertension: Secondary | ICD-10-CM

## 2021-08-18 ENCOUNTER — Ambulatory Visit
Admission: RE | Admit: 2021-08-18 | Discharge: 2021-08-18 | Disposition: A | Discharge status: Home / Self Care | Payer: 59 | Source: Ambulatory Visit | Attending: Sports Medicine | Admitting: Sports Medicine

## 2021-08-18 DIAGNOSIS — M5441 Lumbago with sciatica, right side: Secondary | ICD-10-CM

## 2021-08-26 ENCOUNTER — Other Ambulatory Visit: Payer: Self-pay

## 2021-08-26 MED ORDER — VYVANSE 20 MG PO CAPS
ORAL_CAPSULE | ORAL | 0 refills | Status: DC
  Filled 2021-08-26: qty 30, 30d supply, fill #0

## 2021-08-28 ENCOUNTER — Other Ambulatory Visit: Payer: Self-pay

## 2021-08-30 ENCOUNTER — Other Ambulatory Visit (INDEPENDENT_AMBULATORY_CARE_PROVIDER_SITE_OTHER): Payer: Self-pay | Admitting: Family Medicine

## 2021-08-30 DIAGNOSIS — E559 Vitamin D deficiency, unspecified: Secondary | ICD-10-CM

## 2021-08-31 ENCOUNTER — Other Ambulatory Visit (INDEPENDENT_AMBULATORY_CARE_PROVIDER_SITE_OTHER): Payer: Self-pay | Admitting: Family Medicine

## 2021-08-31 DIAGNOSIS — E8881 Metabolic syndrome: Secondary | ICD-10-CM

## 2021-10-09 ENCOUNTER — Other Ambulatory Visit: Payer: Self-pay

## 2021-10-09 MED ORDER — OZEMPIC (0.25 OR 0.5 MG/DOSE) 2 MG/3ML ~~LOC~~ SOPN
PEN_INJECTOR | SUBCUTANEOUS | 0 refills | Status: DC
  Filled 2021-10-09: qty 3, 30d supply, fill #0

## 2021-10-12 ENCOUNTER — Other Ambulatory Visit: Payer: Self-pay

## 2021-10-13 ENCOUNTER — Other Ambulatory Visit: Payer: Self-pay

## 2021-10-14 ENCOUNTER — Other Ambulatory Visit: Payer: Self-pay

## 2021-10-15 ENCOUNTER — Other Ambulatory Visit: Payer: Self-pay

## 2021-10-21 ENCOUNTER — Encounter (INDEPENDENT_AMBULATORY_CARE_PROVIDER_SITE_OTHER): Payer: Self-pay

## 2021-11-05 ENCOUNTER — Other Ambulatory Visit: Payer: Self-pay

## 2021-11-05 MED ORDER — ONDANSETRON HCL 4 MG PO TABS
ORAL_TABLET | ORAL | 1 refills | Status: AC
  Filled 2021-11-05: qty 15, 30d supply, fill #0

## 2021-11-06 ENCOUNTER — Other Ambulatory Visit: Payer: Self-pay

## 2022-02-11 ENCOUNTER — Other Ambulatory Visit: Payer: Self-pay

## 2022-02-12 ENCOUNTER — Other Ambulatory Visit: Payer: Self-pay

## 2022-02-17 ENCOUNTER — Other Ambulatory Visit: Payer: Self-pay | Admitting: Family Medicine

## 2022-02-17 DIAGNOSIS — Z1231 Encounter for screening mammogram for malignant neoplasm of breast: Secondary | ICD-10-CM

## 2022-02-19 ENCOUNTER — Other Ambulatory Visit: Payer: Self-pay

## 2022-02-22 ENCOUNTER — Other Ambulatory Visit: Payer: Self-pay | Admitting: Obstetrics

## 2022-02-22 ENCOUNTER — Other Ambulatory Visit: Payer: Self-pay

## 2022-02-22 DIAGNOSIS — N644 Mastodynia: Secondary | ICD-10-CM

## 2022-02-25 ENCOUNTER — Other Ambulatory Visit: Payer: Self-pay

## 2022-03-05 DIAGNOSIS — Z1231 Encounter for screening mammogram for malignant neoplasm of breast: Secondary | ICD-10-CM

## 2022-03-10 ENCOUNTER — Ambulatory Visit
Admission: RE | Admit: 2022-03-10 | Discharge: 2022-03-10 | Disposition: A | Discharge status: Home / Self Care | Payer: 59 | Source: Ambulatory Visit | Attending: Family Medicine | Admitting: Family Medicine

## 2022-03-10 ENCOUNTER — Other Ambulatory Visit: Payer: Self-pay | Admitting: Family Medicine

## 2022-03-10 DIAGNOSIS — M79645 Pain in left finger(s): Secondary | ICD-10-CM

## 2022-04-23 ENCOUNTER — Ambulatory Visit
Admission: RE | Admit: 2022-04-23 | Discharge: 2022-04-23 | Disposition: A | Discharge status: Home / Self Care | Payer: 59 | Source: Ambulatory Visit | Attending: Obstetrics | Admitting: Obstetrics

## 2022-04-23 ENCOUNTER — Ambulatory Visit: Payer: 59

## 2022-04-23 DIAGNOSIS — N644 Mastodynia: Secondary | ICD-10-CM

## 2022-07-28 NOTE — Progress Notes (Signed)
Cardiology Office Note:    Date:  08/04/2022   ID:  Andrea Gilbert, Andrea Gilbert 1976/01/22, MRN [id number]  PCP:  Blair Heys, MD  [facility] Providers Cardiologist:  Charlton Haws, MD     Referring MD: Blair Heys, MD   Chief Complaint:  Palpitations     History of Present Illness:   Andrea Gilbert is a 47 y.o. female with  history of chest pain, coronary calcium score 0 2018 and 2022, family history of CAD, HTN, DM with insulin resistance, obesity.     Patient comes in for f/u. Having palpitations at night when she lays down. Heart was racing last week. Got up coughed, drank water, calmed down and restarted. Happened 4 times - lasting 5-10 min. Has occurred 3-4 times since Jan. Occurred when watching TV. Never exertional. Was going to the gym walking on the treadmill but hasn't done recently. Drinks 2-3 cups caffeine daily.       Past Medical History:  Diagnosis Date   ADD (attention deficit disorder) 09/2009   Anxiety    Back pain    Chest pain    Constipation    Gallbladder problem    GERD (gastroesophageal reflux disease)    Hypertension    IBS (irritable bowel syndrome)    Joint pain    Migraine    last one 01/2017, otc med prn   Multiple food allergies    Palpitations    PONV (postoperative nausea and vomiting)    Stress reaction 2010   Thoracic outlet syndrome    Current Medications: Current Meds  Medication Sig   cyclobenzaprine (FLEXERIL) 10 MG tablet Take 10 mg by mouth 3 (three) times daily as needed for muscle spasms.   EPINEPHrine 0.3 mg/0.3 mL IJ SOAJ injection Inject 0.3 mg into the muscle once.   lisdexamfetamine (VYVANSE) 20 MG capsule Take 1 capsule by mouth every morning.   lisinopril (ZESTRIL) 5 MG tablet Take 1 tablet (5 mg total) by mouth daily.   metoprolol succinate (TOPROL-XL) 50 MG 24 hr tablet Take 1 tablet (50 mg total) by mouth daily. Take with or immediately following a meal.   nitrofurantoin (MACRODANTIN) 50 MG  capsule Take 1 capsule by mouth after intercourse to prevent UTI   omeprazole (PRILOSEC) 20 MG capsule TAKE 2 CAPSULES BY MOUTH 30 MINUTES BEFORE MORNING MEAL ONCE A DAY 30 DAY(S)   ondansetron (ZOFRAN) 4 MG tablet 1 tablet as needed nausea Orally every 8 hours as needed for nausea 30 day(s)   Semaglutide,0.25 or 0.5MG /DOS, (OZEMPIC, 0.25 OR 0.5 MG/DOSE,) 2 MG/3ML SOPN Inject 0.25mg  Subcutaneous once weekly 30 days    Allergies:   Citric acid, Concerta [methylphenidate hcl], Tomato, Adderall [amphetamine-dextroamphetamine], Atomoxetine, Strattera [atomoxetine hcl], and Sulfa antibiotics   Social History   Tobacco Use   Smoking status: Never   Smokeless tobacco: Never  Vaping Use   Vaping Use: Never used  Substance Use Topics   Alcohol use: Yes    Comment: occasional   Drug use: No    Family Hx: The patient's family history includes Alcoholism in her father; CAD in her father; Cancer in her father; Diabetes in her father and paternal grandfather; Heart disease in her father; Hyperlipidemia in her father and mother; Hypertension in her father and mother.  ROS     Physical Exam:    VS:  BP (!) 175/100   Pulse 95   Ht 5\' 3"  (1.6 m)   Wt 196 lb 9.6 oz (89.2 kg)  SpO2 95%   BMI 34.83 kg/m     Wt Readings from Last 3 Encounters:  08/04/22 196 lb 9.6 oz (89.2 kg)  06/24/21 202 lb (91.6 kg)  06/02/21 200 lb (90.7 kg)    Physical Exam  GEN: Well nourished, well developed, in no acute distress  Neck: no JVD, carotid bruits, or masses Cardiac:RRR; no murmurs, rubs, or gallops  Respiratory:  clear to auscultation bilaterally, normal work of breathing GI: soft, nontender, nondistended, + BS Ext: without cyanosis, clubbing, or edema, Good distal pulses bilaterally Neuro:  Alert and Oriented x 3,  Psych: euthymic mood, full affect        EKGs/Labs/Other Test Reviewed:    EKG:  EKG is  ordered today.  The ekg ordered today demonstrates NSR 95/m LVH  Recent Labs: No results  found for requested labs within last 365 days.   Recent Lipid Panel No results for input(s): "CHOL", "TRIG", "HDL", "VLDL", "LDLCALC", "LDLDIRECT" in the last 8760 hours.   Prior CV Studies:    Coronary calcium score 11/2020 FINDINGS: Coronary Calcium Score:   Left main: 0   Left anterior descending artery: 0   Left circumflex artery: 0   Right coronary artery: 0   Total: 0   Percentile: 0   Pericardium: Normal.   Ascending Aorta: Normal caliber.   Non-cardiac: See separate report from [facility].   IMPRESSION: Coronary calcium score of 0. This was 0 percentile for age-, race-, and sex-matched controls.   Risk Assessment/Calculations/Metrics:         HYPERTENSION CONTROL Vitals:   08/04/22 1218 08/04/22 1249  BP: (!) 146/100 (!) 175/100    The patient's blood pressure is elevated above target today.  In order to address the patient's elevated BP: A current anti-hypertensive medication was adjusted today.; Blood pressure will be monitored at home to determine if medication changes need to be made.; The blood pressure is usually elevated in clinic.  Blood pressures monitored at home have been optimal.       ASSESSMENT & PLAN:   No problem-specific Assessment & Plan notes found for this encounter.   History of chest pain-calcium score 0 2018 and 2022-no recent chest pain but elevated LDL 153. Would like to check coronary calcium score to see if she needs a statin. May start wegovy tomorrow and going back to gym  Palpitations since Jan and watch ? Afib but no strips for [place] to see. Not occurring enough to catch on 2 week monitor. Thyroid studies normal last year. Will increase toprol 50 mg in am 25 mg pm. She'll call if it increases.  HTN BP high-has white coat syndrome and BP better at home. Going back to the gym and starting wegovy for weight loss which will help. Also increasing metoprolol  DM A1C 5.5 last year on metformin  Obesity-starting wegovy  and going to healthy weight loss  Family history CAD            Dispo:  No follow-ups on file.   Medication Adjustments/Labs and Tests Ordered: Current medicines are reviewed at length with the patient today.  Concerns regarding medicines are outlined above.  Tests Ordered: No orders of the defined types were placed in this encounter.  Medication Changes: No orders of the defined types were placed in this encounter.  Elson Clan, PA-C  08/04/2022 12:54 PM    [facility] [address] [place], [place], [place]  [address] Phone: [phone]; Fax: [phone]

## 2022-08-04 ENCOUNTER — Encounter: Payer: Self-pay | Admitting: Physician Assistant

## 2022-08-04 ENCOUNTER — Ambulatory Visit: Payer: 59 | Attending: Physician Assistant | Admitting: Physician Assistant

## 2022-08-04 VITALS — BP 175/100 | HR 95 | Ht 63.0 in | Wt 196.6 lb

## 2022-08-04 DIAGNOSIS — E88819 Insulin resistance, unspecified: Secondary | ICD-10-CM

## 2022-08-04 DIAGNOSIS — E6609 Other obesity due to excess calories: Secondary | ICD-10-CM | POA: Diagnosis not present

## 2022-08-04 DIAGNOSIS — I10 Essential (primary) hypertension: Secondary | ICD-10-CM

## 2022-08-04 DIAGNOSIS — Z8249 Family history of ischemic heart disease and other diseases of the circulatory system: Secondary | ICD-10-CM

## 2022-08-04 DIAGNOSIS — R072 Precordial pain: Secondary | ICD-10-CM

## 2022-08-04 DIAGNOSIS — R002 Palpitations: Secondary | ICD-10-CM

## 2022-08-04 DIAGNOSIS — Z6834 Body mass index (BMI) 34.0-34.9, adult: Secondary | ICD-10-CM

## 2022-08-04 MED ORDER — METOPROLOL SUCCINATE ER 50 MG PO TB24
ORAL_TABLET | ORAL | 1 refills | Status: DC
Start: 2022-08-04 — End: 2023-02-23

## 2022-08-04 NOTE — Patient Instructions (Addendum)
Medication Instructions:  INCREASE Metoprolol to 50mg  in the morning and 25mg  in the evening  *If you need a refill on your cardiac medications before your next appointment, please call your pharmacy*   Lab Work: None ordered If you have labs (blood work) drawn today and your tests are completely normal, you will receive your results only by: MyChart Message (if you have MyChart) OR A paper copy in the mail If you have any lab test that is abnormal or we need to change your treatment, we will call you to review the results.   Testing/Procedures:  CALCIUM SCORE SELF PAY TEST    Follow-Up: At [facility], you and your health needs are our priority.  As part of our continuing mission to provide you with exceptional heart care, we have created designated Provider Care Teams.  These Care Teams include your primary Cardiologist (physician) and Advanced Practice Providers (APPs -  Physician Assistants and Nurse Practitioners) who all work together to provide you with the care you need, when you need it.  We recommend signing up for the patient portal called "MyChart".  Sign up information is provided on this After Visit Summary.  MyChart is used to connect with patients for Virtual Visits (Telemedicine).  Patients are able to view lab/test results, encounter notes, upcoming appointments, etc.  Non-urgent messages can be sent to your provider as well.   To learn more about what you can do with MyChart, go to [email or web address].    Your next appointment:   12 month(s)  Provider:   Charlton Haws, MD     Other Instructions PLEASE CONTACT THE OFFICE SOONER IF YOU HAVE ANY CONCERNS.

## 2022-08-13 ENCOUNTER — Ambulatory Visit (HOSPITAL_BASED_OUTPATIENT_CLINIC_OR_DEPARTMENT_OTHER)
Admission: RE | Admit: 2022-08-13 | Discharge: 2022-08-13 | Disposition: A | Discharge status: Home / Self Care | Payer: 59 | Source: Ambulatory Visit | Attending: Physician Assistant | Admitting: Physician Assistant

## 2022-08-13 DIAGNOSIS — E6609 Other obesity due to excess calories: Secondary | ICD-10-CM | POA: Insufficient documentation

## 2022-08-13 DIAGNOSIS — I10 Essential (primary) hypertension: Secondary | ICD-10-CM | POA: Insufficient documentation

## 2022-08-13 DIAGNOSIS — Z6834 Body mass index (BMI) 34.0-34.9, adult: Secondary | ICD-10-CM | POA: Insufficient documentation

## 2022-08-13 DIAGNOSIS — Z8249 Family history of ischemic heart disease and other diseases of the circulatory system: Secondary | ICD-10-CM | POA: Insufficient documentation

## 2022-08-13 DIAGNOSIS — R002 Palpitations: Secondary | ICD-10-CM | POA: Insufficient documentation

## 2022-08-13 DIAGNOSIS — E88819 Insulin resistance, unspecified: Secondary | ICD-10-CM | POA: Insufficient documentation

## 2022-08-13 DIAGNOSIS — R072 Precordial pain: Secondary | ICD-10-CM | POA: Insufficient documentation

## 2022-10-28 ENCOUNTER — Other Ambulatory Visit (HOSPITAL_COMMUNITY): Payer: Self-pay

## 2022-10-28 MED ORDER — ZEPBOUND 15 MG/0.5ML ~~LOC~~ SOAJ
15.0000 mg | SUBCUTANEOUS | 0 refills | Status: DC
  Filled 2022-10-28: qty 2, 28d supply, fill #0

## 2022-12-09 ENCOUNTER — Other Ambulatory Visit (HOSPITAL_COMMUNITY): Payer: Self-pay

## 2022-12-09 MED ORDER — ZEPBOUND 15 MG/0.5ML ~~LOC~~ SOAJ
15.0000 mg | SUBCUTANEOUS | 3 refills | Status: AC
  Filled 2022-12-09: qty 2, 28d supply, fill #0

## 2023-01-25 ENCOUNTER — Other Ambulatory Visit (HOSPITAL_COMMUNITY): Payer: Self-pay

## 2023-01-25 MED ORDER — ZEPBOUND 15 MG/0.5ML ~~LOC~~ SOAJ
15.0000 mg | SUBCUTANEOUS | 3 refills | Status: AC
  Filled 2023-01-25: qty 2, 28d supply, fill #0

## 2023-01-27 ENCOUNTER — Other Ambulatory Visit (HOSPITAL_COMMUNITY): Payer: Self-pay

## 2023-01-27 MED ORDER — LISDEXAMFETAMINE DIMESYLATE 40 MG PO CAPS
40.0000 mg | ORAL_CAPSULE | Freq: Every morning | ORAL | 0 refills | Status: AC
  Filled 2023-01-27: qty 30, 30d supply, fill #0

## 2023-01-27 MED ORDER — ZEPBOUND 15 MG/0.5ML ~~LOC~~ SOAJ
15.0000 mg | SUBCUTANEOUS | 1 refills | Status: AC
  Filled 2023-01-27: qty 2, 28d supply, fill #0

## 2023-01-31 ENCOUNTER — Other Ambulatory Visit (HOSPITAL_COMMUNITY): Payer: Self-pay

## 2023-02-01 ENCOUNTER — Other Ambulatory Visit (HOSPITAL_COMMUNITY): Payer: Self-pay

## 2023-02-01 MED ORDER — LISDEXAMFETAMINE DIMESYLATE 50 MG PO CHEW
CHEWABLE_TABLET | ORAL | 0 refills | Status: DC
  Filled 2023-02-01 – 2023-02-02 (×2): qty 30, 30d supply, fill #0

## 2023-02-02 ENCOUNTER — Other Ambulatory Visit (HOSPITAL_COMMUNITY): Payer: Self-pay

## 2023-02-23 ENCOUNTER — Other Ambulatory Visit: Payer: Self-pay | Admitting: Physician Assistant

## 2023-03-15 ENCOUNTER — Other Ambulatory Visit (HOSPITAL_COMMUNITY): Payer: Self-pay

## 2023-03-15 ENCOUNTER — Other Ambulatory Visit: Payer: Self-pay

## 2023-03-15 MED ORDER — LISDEXAMFETAMINE DIMESYLATE 50 MG PO CHEW
50.0000 mg | CHEWABLE_TABLET | Freq: Every morning | ORAL | 0 refills | Status: DC
  Filled 2023-03-15: qty 30, 30d supply, fill #0

## 2023-03-15 MED ORDER — ZEPBOUND 15 MG/0.5ML ~~LOC~~ SOAJ
15.0000 mg | SUBCUTANEOUS | 1 refills | Status: AC
Start: 2023-03-15 — End: ?
  Filled 2023-03-15 (×2): qty 2, 28d supply, fill #0

## 2023-03-17 ENCOUNTER — Other Ambulatory Visit (HOSPITAL_COMMUNITY): Payer: Self-pay

## 2023-03-17 ENCOUNTER — Other Ambulatory Visit: Payer: Self-pay | Admitting: Family Medicine

## 2023-03-17 DIAGNOSIS — Z0000 Encounter for general adult medical examination without abnormal findings: Secondary | ICD-10-CM

## 2023-03-31 DIAGNOSIS — Z1231 Encounter for screening mammogram for malignant neoplasm of breast: Secondary | ICD-10-CM

## 2023-04-05 ENCOUNTER — Other Ambulatory Visit (HOSPITAL_COMMUNITY): Payer: Self-pay

## 2023-04-05 MED ORDER — LISINOPRIL 5 MG PO TABS
5.0000 mg | ORAL_TABLET | Freq: Every day | ORAL | 1 refills | Status: AC
Start: 2023-04-05 — End: ?
  Filled 2023-04-05: qty 30, 30d supply, fill #0

## 2023-04-14 ENCOUNTER — Other Ambulatory Visit (HOSPITAL_COMMUNITY): Payer: Self-pay

## 2023-04-14 MED ORDER — ZEPBOUND 15 MG/0.5ML ~~LOC~~ SOAJ
15.0000 mg | SUBCUTANEOUS | 1 refills | Status: AC
  Filled 2023-04-14: qty 2, 28d supply, fill #0

## 2023-04-14 MED ORDER — EPINEPHRINE 0.3 MG/0.3ML IJ SOAJ
INTRAMUSCULAR | 1 refills | Status: AC
  Filled 2023-04-14: qty 2, 2d supply, fill #0

## 2023-04-15 ENCOUNTER — Other Ambulatory Visit (HOSPITAL_COMMUNITY): Payer: Self-pay

## 2023-04-26 ENCOUNTER — Ambulatory Visit
Admission: RE | Admit: 2023-04-26 | Discharge: 2023-04-26 | Disposition: A | Discharge status: Home / Self Care | Payer: 59 | Source: Ambulatory Visit | Attending: Family Medicine | Admitting: Family Medicine

## 2023-04-26 DIAGNOSIS — Z0000 Encounter for general adult medical examination without abnormal findings: Secondary | ICD-10-CM

## 2023-05-11 ENCOUNTER — Other Ambulatory Visit (HOSPITAL_COMMUNITY): Payer: Self-pay

## 2023-05-11 MED ORDER — ZEPBOUND 15 MG/0.5ML ~~LOC~~ SOAJ
15.0000 mg | SUBCUTANEOUS | 1 refills | Status: AC
Start: 2023-05-11 — End: ?
  Filled 2023-05-11: qty 2, 28d supply, fill #0

## 2023-05-18 ENCOUNTER — Other Ambulatory Visit (HOSPITAL_COMMUNITY): Payer: Self-pay

## 2023-05-18 MED ORDER — FLUCONAZOLE 150 MG PO TABS
150.0000 mg | ORAL_TABLET | Freq: Every day | ORAL | 0 refills | Status: DC
  Filled 2023-05-18: qty 2, 10d supply, fill #0

## 2023-05-18 MED ORDER — CEPHALEXIN 500 MG PO CAPS
500.0000 mg | ORAL_CAPSULE | Freq: Three times a day (TID) | ORAL | 0 refills | Status: DC
  Filled 2023-05-18: qty 21, 7d supply, fill #0

## 2023-05-26 ENCOUNTER — Other Ambulatory Visit (HOSPITAL_COMMUNITY): Payer: Self-pay

## 2023-05-27 ENCOUNTER — Other Ambulatory Visit (HOSPITAL_COMMUNITY): Payer: Self-pay

## 2023-06-23 ENCOUNTER — Other Ambulatory Visit (HOSPITAL_COMMUNITY): Payer: Self-pay

## 2023-06-23 MED ORDER — ZEPBOUND 15 MG/0.5ML ~~LOC~~ SOAJ
15.0000 mg | SUBCUTANEOUS | 2 refills | Status: AC
Start: 2023-06-23 — End: ?
  Filled 2023-06-23: qty 2, 28d supply, fill #0

## 2023-07-14 ENCOUNTER — Other Ambulatory Visit (HOSPITAL_BASED_OUTPATIENT_CLINIC_OR_DEPARTMENT_OTHER): Payer: Self-pay

## 2023-07-14 ENCOUNTER — Other Ambulatory Visit (HOSPITAL_COMMUNITY): Payer: Self-pay

## 2023-07-14 MED ORDER — ZEPBOUND 15 MG/0.5ML ~~LOC~~ SOAJ
15.0000 mg | SUBCUTANEOUS | 2 refills | Status: AC
  Filled 2023-07-14: qty 2, 28d supply, fill #0

## 2023-07-20 ENCOUNTER — Other Ambulatory Visit: Payer: Self-pay | Admitting: Sports Medicine

## 2023-07-20 DIAGNOSIS — M25551 Pain in right hip: Secondary | ICD-10-CM

## 2023-07-20 DIAGNOSIS — G8929 Other chronic pain: Secondary | ICD-10-CM

## 2023-07-20 DIAGNOSIS — M5441 Lumbago with sciatica, right side: Secondary | ICD-10-CM

## 2023-07-26 ENCOUNTER — Encounter: Payer: Self-pay | Admitting: Sports Medicine

## 2023-07-27 ENCOUNTER — Other Ambulatory Visit: Payer: Self-pay | Admitting: Physician Assistant

## 2023-08-11 ENCOUNTER — Ambulatory Visit
Admission: RE | Admit: 2023-08-11 | Discharge: 2023-08-11 | Disposition: A | Discharge status: Home / Self Care | Source: Ambulatory Visit | Attending: Sports Medicine | Admitting: Sports Medicine

## 2023-08-11 DIAGNOSIS — G8929 Other chronic pain: Secondary | ICD-10-CM

## 2023-08-11 DIAGNOSIS — M5441 Lumbago with sciatica, right side: Secondary | ICD-10-CM

## 2023-08-11 DIAGNOSIS — M25551 Pain in right hip: Secondary | ICD-10-CM

## 2023-08-11 MED ORDER — IOPAMIDOL (ISOVUE-M 200) INJECTION 41%
20.0000 mL | Freq: Once | INTRAMUSCULAR | Status: AC
  Administered 2023-08-11: 20 mL via INTRA_ARTICULAR

## 2023-08-16 ENCOUNTER — Other Ambulatory Visit (HOSPITAL_COMMUNITY): Payer: Self-pay

## 2023-08-16 ENCOUNTER — Other Ambulatory Visit (HOSPITAL_BASED_OUTPATIENT_CLINIC_OR_DEPARTMENT_OTHER): Payer: Self-pay

## 2023-08-16 MED ORDER — FLUCONAZOLE 150 MG PO TABS
150.0000 mg | ORAL_TABLET | Freq: Every day | ORAL | 0 refills | Status: AC
  Filled 2023-08-16: qty 2, 3d supply, fill #0

## 2023-08-16 MED ORDER — PREDNISONE 5 MG (21) PO TBPK
ORAL_TABLET | ORAL | 0 refills | Status: DC
  Filled 2023-08-16: qty 21, 6d supply, fill #0

## 2023-08-16 MED ORDER — HYDROCODONE BIT-HOMATROP MBR 5-1.5 MG/5ML PO SOLN
5.0000 mL | Freq: Four times a day (QID) | ORAL | 0 refills | Status: DC
  Filled 2023-08-16: qty 140, 7d supply, fill #0

## 2023-08-16 MED ORDER — ZEPBOUND 15 MG/0.5ML ~~LOC~~ SOAJ
15.0000 mg | SUBCUTANEOUS | 2 refills | Status: AC
  Filled 2023-08-16: qty 2, 28d supply, fill #0

## 2023-08-16 MED ORDER — AZITHROMYCIN 250 MG PO TABS
500.0000 mg | ORAL_TABLET | Freq: Every day | ORAL | 0 refills | Status: DC
  Filled 2023-08-16: qty 6, 5d supply, fill #0

## 2023-10-03 ENCOUNTER — Other Ambulatory Visit: Payer: Self-pay | Admitting: Cardiovascular Disease

## 2023-10-31 NOTE — Progress Notes (Signed)
 Cardiology Office Note:    Date:  11/11/2023   ID:  Andrea, Gilbert 12/06/1975, MRN [id number]  PCP:  Auston Opal, DO  [facility] Providers Cardiologist:  Maude Emmer, MD       History of Present Illness:   Andrea Gilbert is a 48 y.o. female with  history of chest pain, coronary calcium score 0 2018 and 2022, family history of CAD, HTN, DM with insulin resistance, obesity.     Seen by PA 08/04/22. Having palpitations at night when she lays down. Heart was racing last week. Got up coughed, drank water, calmed down and restarted. Happened 4 times - lasting 5-10 min. Has occurred 3-4 times since Jan. Occurred when watching TV. Never exertional. Was going to the gym walking on the treadmill but hasn't done recently. Drinks 2-3 cups caffeine daily.   Husband has some health issues and father in law always in hospital Son Andrea Gilbert back home with her getting a divorce  BP on repeat by me 150/90 mmHg She runs normal at home      Past Medical History:  Diagnosis Date   ADD (attention deficit disorder) 09/2009   Anxiety    Back pain    Chest pain    Constipation    Gallbladder problem    GERD (gastroesophageal reflux disease)    Hypertension    IBS (irritable bowel syndrome)    Joint pain    Migraine    last one 01/2017, otc med prn   Multiple food allergies    Palpitations    PONV (postoperative nausea and vomiting)    Stress reaction 2010   Thoracic outlet syndrome    Current Medications: Current Meds  Medication Sig   cyclobenzaprine (FLEXERIL) 10 MG tablet Take 10 mg by mouth 3 (three) times daily as needed for muscle spasms.   EPINEPHrine  (EPIPEN  2-PAK) 0.3 mg/0.3 mL IJ SOAJ injection Inject as directed if anaphylaxis occurs   EPINEPHrine  0.3 mg/0.3 mL IJ SOAJ injection Inject 0.3 mg into the muscle once.   fluconazole  (DIFLUCAN ) 150 MG tablet Take 1 tablet (150 mg total) by mouth daily for one day.  Repeat in 3 days if needed.   lisdexamfetamine  (VYVANSE ) 40 MG capsule Take 1 capsule (40 mg total) by mouth in the morning.   lisinopril  (ZESTRIL ) 5 MG tablet Take 1 tablet (5 mg total) by mouth daily.   metoprolol  succinate (TOPROL -XL) 50 MG 24 hr tablet TAKE 1 TAB BY MOUTH IN THE  MORNING AND ONE-HALF TAB IN THE  EVENING TAKE WITH OR IMMEDIATELY FOLLOWING MEALS   nitrofurantoin  (MACRODANTIN ) 50 MG capsule Take 1 capsule by mouth after intercourse to prevent UTI   omeprazole (PRILOSEC) 20 MG capsule TAKE 2 CAPSULES BY MOUTH 30 MINUTES BEFORE MORNING MEAL ONCE A DAY 30 DAY(S)   ondansetron  (ZOFRAN ) 4 MG tablet 1 tablet as needed nausea Orally every 8 hours as needed for nausea 30 day(s)   tirzepatide  (ZEPBOUND ) 15 MG/0.5ML Pen Inject 15 mg into the skin once a week.   tirzepatide  (ZEPBOUND ) 15 MG/0.5ML Pen Inject 15 mg into the skin once a week.   tirzepatide  (ZEPBOUND ) 15 MG/0.5ML Pen Inject 15 mg into the skin once a week.   tirzepatide  (ZEPBOUND ) 15 MG/0.5ML Pen Inject 15 mg into the skin once a week.   tirzepatide  (ZEPBOUND ) 15 MG/0.5ML Pen Inject 15 mg into the skin once a week.   tirzepatide  (ZEPBOUND ) 15 MG/0.5ML Pen Inject 15 mg into the skin once a week.  tirzepatide  (ZEPBOUND ) 15 MG/0.5ML Pen Inject 15 mg into the skin once a week.   tirzepatide  (ZEPBOUND ) 15 MG/0.5ML Pen Inject 15 mg into the skin once a week.   tirzepatide  (ZEPBOUND ) 15 MG/0.5ML Pen Inject 15 mg into the skin once a week.   tirzepatide  (ZEPBOUND ) 15 MG/0.5ML Pen Inject 15 mg into the skin once a week.   [DISCONTINUED] azithromycin  (ZITHROMAX ) 250 MG tablet Take 2 tablets (500 mg total) by mouth daily for one day, then 1 tablet by mouth on day 2-5    Allergies:   Citric acid, Concerta [methylphenidate hcl], Tomato, Adderall [amphetamine-dextroamphetamine], Atomoxetine, Strattera [atomoxetine hcl], and Sulfa antibiotics   Social History   Tobacco Use   Smoking status: Never   Smokeless tobacco: Never  Vaping Use   Vaping status: Never Used  Substance Use  Topics   Alcohol use: Yes    Comment: occasional   Drug use: No    Family Hx: The patient's family history includes Alcoholism in her father; CAD in her father; Cancer in her father; Diabetes in her father and paternal grandfather; Heart disease in her father; Hyperlipidemia in her father and mother; Hypertension in her father and mother.  ROS     Physical Exam:    VS:  BP (!) 163/100   Pulse (!) 101   Ht 5' 3 (1.6 m)   Wt 197 lb (89.4 kg)   SpO2 99%   BMI 34.90 kg/m     Wt Readings from Last 3 Encounters:  11/11/23 197 lb (89.4 kg)  08/04/22 196 lb 9.6 oz (89.2 kg)  06/24/21 202 lb (91.6 kg)    Physical Exam  GEN: Well nourished, well developed, in no acute distress  Neck: no JVD, carotid bruits, or masses Cardiac:RRR; no murmurs, rubs, or gallops  Respiratory:  clear to auscultation bilaterally, normal work of breathing GI: soft, nontender, nondistended, + BS Ext: without cyanosis, clubbing, or edema, Good distal pulses bilaterally Neuro:  Alert and Oriented x 3,  Psych: euthymic mood, full affect        EKGs/Labs/Other Test Reviewed:    EKG:  EKG is  ordered today.  The ekg ordered today demonstrates NSR 95/m LVH  Recent Labs: No results found for requested labs within last 365 days.   Recent Lipid Panel No results for input(s): CHOL, TRIG, HDL, VLDL, LDLCALC, LDLDIRECT in the last 8760 hours.   Prior CV Studies:    Coronary calcium score 11/2020 FINDINGS: Coronary Calcium Score:   Left main: 0   Left anterior descending artery: 0   Left circumflex artery: 0   Right coronary artery: 0   Total: 0   Percentile: 0   Pericardium: Normal.   Ascending Aorta: Normal caliber.   Non-cardiac: See separate report from [facility].   IMPRESSION: Coronary calcium score of 0. This was 0 percentile for age-, race-, and sex-matched controls.    ASSESSMENT & PLAN:   No problem-specific Assessment & Plan notes found for this  encounter.   Chest pain-calcium score 0 2018 and 2022-no recent chest pain but elevated LDL 153. Calcium score again 0 08/18/22 and elected not to start statin Normal ETT 2018  Palpitations resolved with higher dose Toprol  no need for monitor   HTN BP high-has white coat syndrome and BP better at home. Going back to the gym has used Zepbound  and ozempic  for weight loss   DM A1C 5.5 last year on metformin   Obesity-has been on GLP-1 and been seen at [facility]  Monitor BP at home   Dispo:  No follow-ups on file.   Medication Adjustments/Labs and Tests Ordered: Current medicines are reviewed at length with the patient today.  Concerns regarding medicines are outlined above.  Tests Ordered: Orders Placed This Encounter  Procedures   EKG 12-Lead   Medication Changes: No orders of the defined types were placed in this encounter.  Signed, Maude Emmer, MD  11/11/2023 9:03 AM    [facility] [address] [place], [place], [place]  [address] Phone: [phone]; Fax: [phone]

## 2023-11-02 ENCOUNTER — Other Ambulatory Visit (HOSPITAL_COMMUNITY): Payer: Self-pay

## 2023-11-02 MED ORDER — ZEPBOUND 15 MG/0.5ML ~~LOC~~ SOAJ
15.0000 mg | SUBCUTANEOUS | 1 refills | Status: AC
  Filled 2023-11-02: qty 2, 28d supply, fill #0

## 2023-11-11 ENCOUNTER — Ambulatory Visit: Attending: Cardiovascular Disease | Admitting: Cardiovascular Disease

## 2023-11-11 ENCOUNTER — Encounter: Payer: Self-pay | Admitting: Cardiovascular Disease

## 2023-11-11 VITALS — BP 163/100 | HR 101 | Ht 63.0 in | Wt 197.0 lb

## 2023-11-11 DIAGNOSIS — I10 Essential (primary) hypertension: Secondary | ICD-10-CM | POA: Diagnosis not present

## 2023-11-11 DIAGNOSIS — R002 Palpitations: Secondary | ICD-10-CM | POA: Diagnosis not present

## 2023-11-11 DIAGNOSIS — E663 Overweight: Secondary | ICD-10-CM

## 2023-11-11 NOTE — Patient Instructions (Signed)
 Medication Instructions:  Your physician recommends that you continue on your current medications as directed. Please refer to the Current Medication list given to you today.  *If you need a refill on your cardiac medications before your next appointment, please call your pharmacy*  Lab Work: If you have labs (blood work) drawn today and your tests are completely normal, you will receive your results only by: MyChart Message (if you have MyChart) OR A paper copy in the mail If you have any lab test that is abnormal or we need to change your treatment, we will call you to review the results.  Testing/Procedures: None ordered today  Follow-Up: At [facility], you and your health needs are our priority.  As part of our continuing mission to provide you with exceptional heart care, our providers are all part of one team.  This team includes your primary Cardiologist (physician) and Advanced Practice Providers or APPs (Physician Assistants and Nurse Practitioners) who all work together to provide you with the care you need, when you need it.  Your next appointment:   1 year  Provider:   Maude Emmer, MD    We recommend signing up for the patient portal called MyChart.  Sign up information is provided on this After Visit Summary.  MyChart is used to connect with patients for Virtual Visits (Telemedicine).  Patients are able to view lab/test results, encounter notes, upcoming appointments, etc.  Non-urgent messages can be sent to your provider as well.   To learn more about what you can do with MyChart, go to [email or web address].

## 2023-12-14 ENCOUNTER — Other Ambulatory Visit: Payer: Self-pay | Admitting: Cardiovascular Disease

## 2024-01-16 ENCOUNTER — Other Ambulatory Visit (HOSPITAL_COMMUNITY): Payer: Self-pay

## 2024-01-16 ENCOUNTER — Other Ambulatory Visit: Payer: Self-pay

## 2024-01-16 MED ORDER — LISDEXAMFETAMINE DIMESYLATE 50 MG PO CHEW
1.0000 | CHEWABLE_TABLET | Freq: Every morning | ORAL | 0 refills | Status: AC
  Filled 2024-01-16: qty 30, 30d supply, fill #0

## 2024-01-17 ENCOUNTER — Other Ambulatory Visit (HOSPITAL_COMMUNITY): Payer: Self-pay

## 2024-03-14 ENCOUNTER — Other Ambulatory Visit (HOSPITAL_BASED_OUTPATIENT_CLINIC_OR_DEPARTMENT_OTHER): Payer: Self-pay | Admitting: Family Medicine

## 2024-03-14 DIAGNOSIS — Z9189 Other specified personal risk factors, not elsewhere classified: Secondary | ICD-10-CM

## 2024-03-27 ENCOUNTER — Ambulatory Visit (HOSPITAL_BASED_OUTPATIENT_CLINIC_OR_DEPARTMENT_OTHER)
Admission: RE | Admit: 2024-03-27 | Discharge: 2024-03-27 | Disposition: A | Discharge status: Home / Self Care | Payer: Self-pay | Source: Ambulatory Visit | Attending: Family Medicine | Admitting: Family Medicine

## 2024-03-27 ENCOUNTER — Other Ambulatory Visit: Payer: Self-pay | Admitting: Family Medicine

## 2024-03-27 DIAGNOSIS — Z9189 Other specified personal risk factors, not elsewhere classified: Secondary | ICD-10-CM | POA: Insufficient documentation

## 2024-03-27 DIAGNOSIS — Z1231 Encounter for screening mammogram for malignant neoplasm of breast: Secondary | ICD-10-CM

## 2024-04-26 ENCOUNTER — Ambulatory Visit

## 2024-04-26 DIAGNOSIS — Z1231 Encounter for screening mammogram for malignant neoplasm of breast: Secondary | ICD-10-CM
